# Patient Record
Sex: Female | Born: 1980 | Race: White | Hispanic: No | Marital: Single | Smoking: Never smoker
Health system: Southern US, Community
[De-identification: ages and names within clinical notes are randomized; demographics above are authoritative.]

---

## 2006-05-29 ENCOUNTER — Other Ambulatory Visit: Admission: RE | Admit: 2006-05-29 | Discharge: 2006-05-29 | Payer: Self-pay | Admitting: Gynecology

## 2006-07-24 ENCOUNTER — Ambulatory Visit: Payer: Self-pay | Admitting: Family Medicine

## 2006-07-24 LAB — CONVERTED CEMR LAB
Basophils Absolute: 0 10*3/uL (ref 0.0–0.1)
Cholesterol: 185 mg/dL (ref 0–200)
Eosinophils Relative: 2 % (ref 0.0–5.0)
HCT: 37.5 % (ref 36.0–46.0)
Hemoglobin: 13 g/dL (ref 12.0–15.0)
LDL Cholesterol: 97 mg/dL (ref 0–99)
MCHC: 34.6 g/dL (ref 30.0–36.0)
MCV: 87.3 fL (ref 78.0–100.0)
Monocytes Absolute: 0.5 10*3/uL (ref 0.2–0.7)
Neutrophils Relative %: 61.8 % (ref 43.0–77.0)
RBC: 4.29 M/uL (ref 3.87–5.11)
RDW: 12.1 % (ref 11.5–14.6)
WBC: 6.9 10*3/uL (ref 4.5–10.5)

## 2006-09-02 DIAGNOSIS — R8789 Other abnormal findings in specimens from female genital organs: Secondary | ICD-10-CM

## 2006-09-03 ENCOUNTER — Ambulatory Visit: Payer: Self-pay | Admitting: Internal Medicine

## 2006-09-03 LAB — CONVERTED CEMR LAB
Albumin: 3.3 g/dL — ABNORMAL LOW (ref 3.5–5.2)
Alkaline Phosphatase: 51 units/L (ref 39–117)
BUN: 13 mg/dL (ref 6–23)
Basophils Absolute: 0 10*3/uL (ref 0.0–0.1)
Creatinine, Ser: 0.7 mg/dL (ref 0.4–1.2)
Eosinophils Absolute: 0.2 10*3/uL (ref 0.0–0.6)
GFR calc Af Amer: 128 mL/min
GFR calc non Af Amer: 106 mL/min
HCT: 38.3 % (ref 36.0–46.0)
Hemoglobin: 13.3 g/dL (ref 12.0–15.0)
MCHC: 34.6 g/dL (ref 30.0–36.0)
MCV: 87.1 fL (ref 78.0–100.0)
Monocytes Absolute: 0.6 10*3/uL (ref 0.2–0.7)
Monocytes Relative: 7.3 % (ref 3.0–11.0)
Neutro Abs: 5.1 10*3/uL (ref 1.4–7.7)
Neutrophils Relative %: 64.9 % (ref 43.0–77.0)
Potassium: 4 meq/L (ref 3.5–5.1)
RDW: 11.9 % (ref 11.5–14.6)
Sodium: 138 meq/L (ref 135–145)
TSH: 1.92 microintl units/mL (ref 0.35–5.50)
Total Bilirubin: 0.5 mg/dL (ref 0.3–1.2)

## 2006-10-29 ENCOUNTER — Other Ambulatory Visit: Admission: RE | Admit: 2006-10-29 | Discharge: 2006-10-29 | Payer: Self-pay | Admitting: Gynecology

## 2007-01-01 ENCOUNTER — Ambulatory Visit: Payer: Self-pay | Admitting: Family Medicine

## 2007-01-01 ENCOUNTER — Telehealth (INDEPENDENT_AMBULATORY_CARE_PROVIDER_SITE_OTHER): Payer: Self-pay | Admitting: *Deleted

## 2007-01-01 DIAGNOSIS — R002 Palpitations: Secondary | ICD-10-CM

## 2007-01-02 ENCOUNTER — Telehealth (INDEPENDENT_AMBULATORY_CARE_PROVIDER_SITE_OTHER): Payer: Self-pay | Admitting: *Deleted

## 2007-01-03 ENCOUNTER — Telehealth (INDEPENDENT_AMBULATORY_CARE_PROVIDER_SITE_OTHER): Payer: Self-pay | Admitting: *Deleted

## 2007-02-06 ENCOUNTER — Ambulatory Visit: Payer: Self-pay | Admitting: Cardiology

## 2007-02-18 ENCOUNTER — Telehealth (INDEPENDENT_AMBULATORY_CARE_PROVIDER_SITE_OTHER): Payer: Self-pay | Admitting: *Deleted

## 2007-02-20 ENCOUNTER — Telehealth (INDEPENDENT_AMBULATORY_CARE_PROVIDER_SITE_OTHER): Payer: Self-pay | Admitting: *Deleted

## 2007-02-20 ENCOUNTER — Ambulatory Visit: Payer: Self-pay | Admitting: Family Medicine

## 2007-02-20 ENCOUNTER — Encounter (INDEPENDENT_AMBULATORY_CARE_PROVIDER_SITE_OTHER): Payer: Self-pay | Admitting: *Deleted

## 2007-02-20 DIAGNOSIS — J069 Acute upper respiratory infection, unspecified: Secondary | ICD-10-CM | POA: Insufficient documentation

## 2007-02-24 ENCOUNTER — Telehealth (INDEPENDENT_AMBULATORY_CARE_PROVIDER_SITE_OTHER): Payer: Self-pay | Admitting: *Deleted

## 2007-03-05 ENCOUNTER — Other Ambulatory Visit: Admission: RE | Admit: 2007-03-05 | Discharge: 2007-03-05 | Payer: Self-pay | Admitting: Gynecology

## 2007-05-27 ENCOUNTER — Ambulatory Visit: Payer: Self-pay | Admitting: Family Medicine

## 2007-08-27 ENCOUNTER — Other Ambulatory Visit: Admission: RE | Admit: 2007-08-27 | Discharge: 2007-08-27 | Payer: Self-pay | Admitting: Gynecology

## 2008-03-16 ENCOUNTER — Other Ambulatory Visit: Admission: RE | Admit: 2008-03-16 | Discharge: 2008-03-16 | Payer: Self-pay | Admitting: Gynecology

## 2008-05-04 ENCOUNTER — Ambulatory Visit: Payer: Self-pay | Admitting: Family Medicine

## 2008-05-04 DIAGNOSIS — G43009 Migraine without aura, not intractable, without status migrainosus: Secondary | ICD-10-CM | POA: Insufficient documentation

## 2008-05-06 ENCOUNTER — Encounter: Admission: RE | Admit: 2008-05-06 | Discharge: 2008-05-06 | Payer: Self-pay | Admitting: Family Medicine

## 2008-05-06 ENCOUNTER — Telehealth (INDEPENDENT_AMBULATORY_CARE_PROVIDER_SITE_OTHER): Payer: Self-pay | Admitting: *Deleted

## 2008-05-10 ENCOUNTER — Telehealth (INDEPENDENT_AMBULATORY_CARE_PROVIDER_SITE_OTHER): Payer: Self-pay | Admitting: *Deleted

## 2008-05-14 ENCOUNTER — Telehealth (INDEPENDENT_AMBULATORY_CARE_PROVIDER_SITE_OTHER): Payer: Self-pay | Admitting: *Deleted

## 2008-05-18 ENCOUNTER — Telehealth: Payer: Self-pay | Admitting: Family Medicine

## 2008-05-19 ENCOUNTER — Telehealth (INDEPENDENT_AMBULATORY_CARE_PROVIDER_SITE_OTHER): Payer: Self-pay | Admitting: *Deleted

## 2008-05-20 ENCOUNTER — Encounter (INDEPENDENT_AMBULATORY_CARE_PROVIDER_SITE_OTHER): Payer: Self-pay | Admitting: *Deleted

## 2008-05-20 LAB — CONVERTED CEMR LAB
BUN: 19 mg/dL (ref 6–23)
Basophils Absolute: 0.1 10*3/uL (ref 0.0–0.1)
Basophils Relative: 0.8 % (ref 0.0–3.0)
CO2: 28 meq/L (ref 19–32)
Calcium: 9.1 mg/dL (ref 8.4–10.5)
Creatinine, Ser: 0.7 mg/dL (ref 0.4–1.2)
Eosinophils Absolute: 0.1 10*3/uL (ref 0.0–0.7)
Eosinophils Relative: 0.8 % (ref 0.0–5.0)
Glucose, Bld: 93 mg/dL (ref 70–99)
HCT: 33.7 % — ABNORMAL LOW (ref 36.0–46.0)
MCHC: 33.5 g/dL (ref 30.0–36.0)
MCV: 88.4 fL (ref 78.0–100.0)
Monocytes Absolute: 0.5 10*3/uL (ref 0.1–1.0)
RBC: 3.81 M/uL — ABNORMAL LOW (ref 3.87–5.11)
WBC: 8.9 10*3/uL (ref 4.5–10.5)

## 2008-05-24 ENCOUNTER — Telehealth (INDEPENDENT_AMBULATORY_CARE_PROVIDER_SITE_OTHER): Payer: Self-pay | Admitting: *Deleted

## 2008-05-26 ENCOUNTER — Telehealth: Payer: Self-pay | Admitting: Family Medicine

## 2008-06-14 ENCOUNTER — Telehealth (INDEPENDENT_AMBULATORY_CARE_PROVIDER_SITE_OTHER): Payer: Self-pay | Admitting: *Deleted

## 2008-06-17 ENCOUNTER — Telehealth (INDEPENDENT_AMBULATORY_CARE_PROVIDER_SITE_OTHER): Payer: Self-pay | Admitting: *Deleted

## 2008-07-02 ENCOUNTER — Ambulatory Visit: Payer: Self-pay | Admitting: Family Medicine

## 2008-07-27 ENCOUNTER — Telehealth (INDEPENDENT_AMBULATORY_CARE_PROVIDER_SITE_OTHER): Payer: Self-pay | Admitting: *Deleted

## 2008-07-27 ENCOUNTER — Ambulatory Visit: Payer: Self-pay | Admitting: Family Medicine

## 2008-07-27 DIAGNOSIS — H1030 Unspecified acute conjunctivitis, unspecified eye: Secondary | ICD-10-CM | POA: Insufficient documentation

## 2008-11-01 ENCOUNTER — Telehealth (INDEPENDENT_AMBULATORY_CARE_PROVIDER_SITE_OTHER): Payer: Self-pay | Admitting: *Deleted

## 2008-12-02 ENCOUNTER — Ambulatory Visit: Payer: Self-pay | Admitting: Family Medicine

## 2008-12-07 ENCOUNTER — Encounter (INDEPENDENT_AMBULATORY_CARE_PROVIDER_SITE_OTHER): Payer: Self-pay | Admitting: *Deleted

## 2008-12-13 ENCOUNTER — Telehealth (INDEPENDENT_AMBULATORY_CARE_PROVIDER_SITE_OTHER): Payer: Self-pay | Admitting: *Deleted

## 2008-12-16 ENCOUNTER — Ambulatory Visit: Payer: Self-pay | Admitting: Family Medicine

## 2009-01-05 ENCOUNTER — Telehealth: Payer: Self-pay | Admitting: Family Medicine

## 2009-01-19 ENCOUNTER — Telehealth: Payer: Self-pay | Admitting: Family Medicine

## 2009-01-26 ENCOUNTER — Ambulatory Visit: Payer: Self-pay | Admitting: Family Medicine

## 2009-04-19 ENCOUNTER — Encounter: Payer: Self-pay | Admitting: Family Medicine

## 2010-05-21 LAB — CONVERTED CEMR LAB
Basophils Relative: 0.8 % (ref 0.0–1.0)
CO2: 28 meq/L (ref 19–32)
Calcium: 9.3 mg/dL (ref 8.4–10.5)
Chloride: 107 meq/L (ref 96–112)
Eosinophils Absolute: 0.1 10*3/uL (ref 0.0–0.6)
Eosinophils Relative: 1.6 % (ref 0.0–5.0)
GFR calc Af Amer: 85 mL/min
Glucose, Bld: 96 mg/dL (ref 70–99)
HCT: 36.6 % (ref 36.0–46.0)
Lymphocytes Relative: 21.2 % (ref 12.0–46.0)
MCV: 86.9 fL (ref 78.0–100.0)
Neutro Abs: 6 10*3/uL (ref 1.4–7.7)
Neutrophils Relative %: 70.9 % (ref 43.0–77.0)
Platelets: 353 10*3/uL (ref 150–400)
Sodium: 141 meq/L (ref 135–145)
WBC: 8.5 10*3/uL (ref 4.5–10.5)

## 2010-05-23 NOTE — Consult Note (Signed)
Summary: [facility]  [facility]   Imported By: Lanelle Bal 05/05/2009 08:24:23  _____________________________________________________________________  External Attachment:    Type:   Image     Comment:   External Document

## 2010-09-05 NOTE — Assessment & Plan Note (Signed)
[facility]                            CARDIOLOGY OFFICE NOTE   NAME:Heather House, Heather House                        MRN:          [id number]  DATE:02/06/2007                            DOB:          1981-04-18    PRIMARY CARE PHYSICIAN:  Leanne Chang, M.D.   REASON FOR PRESENTATION:  Evaluate patient with palpitations.   HISTORY OF PRESENT ILLNESS:  The patient is a pleasant 30 year old white  female without prior cardiac history. In early September, she had about  10 days of palpitations. She said these would happen 3-4 times a day.  She described a fluttering. This was not a sustained tachy arrhythmia.  It was noticeable but not associated with pain, pre-syncope or syncope.  She did not have any particular shortness of breath with it. It happened  without provocation. She was not drinking caffeine and otherwise felt  well. She is active and goes to the gym about four times a week. She  works aerobically and does not bring on any of these symptoms. She does  not have chest pressure, neck or arm discomfort. She denies any resting  shortness of breath. Denies PND or orthopnea. She says that, actually  since these ten days in September, they have been much less noticeable  and happening very infrequently.   PAST MEDICAL HISTORY:  Pneumonia some years ago.   PAST SURGICAL HISTORY:  None.   ALLERGIES:  None.   MEDICATIONS:  Yaz.   SOCIAL HISTORY:  The patient is single. She has no children. Works at  [employer]. She does not drink alcohol or smoke cigarettes.   FAMILY HISTORY:  Is noncontributory for early coronary artery disease,  sudden cardiac death, syncope, heart failure.   REVIEW OF SYSTEMS:  Positive for occasional mild headaches. Negative for  other systems.   PHYSICAL EXAMINATION:  The patient is in no distress. Blood pressure  124/78, heart rate 87 and regular, weight 190 pounds, Body Mass Index is  32.  HEENT: Eyelids  unremarkable. Pupils equal, round, and reactive to light.  Fundi not visualized. Oral mucosa is unremarkable.  NECK: No jugular venous distention, waveform within normal limits.  Carotid upstroke brisk and symmetrical. No bruits, no thyromegaly.  LYMPHATICS: No cervical, axillary or inguinal adenopathy.  LUNGS: Clear to auscultation bilaterally.  BACK: No costovertebral angle tenderness.  CHEST: Unremarkable.  HEART: PMI not displaced or sustained. S1, S2 within normal limits. No  S3. No S4. No clicks, rub or murmurs.  ABDOMEN: Flat, positive bowel sounds, normal in frequency and pitch. No  bruits. No rebounds. No guarding. No midline pulsatile mass. No  hepatomegaly, no splenomegaly.  SKIN: No rashes, no nodules.  EXTREMITIES: 2+ pulses throughout. No edema, cyanosis or clubbing.  NEURO: Oriented to person, place and time. Cranial nerves II-XII grossly  intact. Motor grossly intact throughout.   EKG: Sinus rhythm with sinus arrhythmia. Axis within normal limits.  Intervals within normal limits. No acute ST-T wave changes.   ASSESSMENT/PLAN:  1. Palpitations. The patient had palpitations in early September.      Currently these are  not particularly bothersome though they are      still happening rarely. She has a normal physical examination and      normal EKG. She reports that she had normal thyroid studies and      other labs at Dr.  Laqueta Linden office. She is not particularly      bothered by these. We had a long discussion about the etiology of      what her probable PACs or PVCs in an otherwise normal heart. At      this point, she is going to let me know if they ever increase in      frequency or severity at which point I would most likely perform an      echocardiogram and Holter monitor and maybe treat with beta-blocker      or calcium channel blocker. Otherwise, I think we can manage this      conservatively.  2. Followup. I will see the patient back as needed.      Rollene Rotunda, MD, [facility]  Electronically Signed    JH/MedQ  DD: 02/06/2007  DT: 02/06/2007  Job #: [id number]   cc:   Leanne Chang, M.D.

## 2015-06-12 ENCOUNTER — Emergency Department (HOSPITAL_BASED_OUTPATIENT_CLINIC_OR_DEPARTMENT_OTHER): Payer: BLUE CROSS/BLUE SHIELD

## 2015-06-12 ENCOUNTER — Observation Stay (HOSPITAL_BASED_OUTPATIENT_CLINIC_OR_DEPARTMENT_OTHER)
Admission: EM | Admit: 2015-06-12 | Discharge: 2015-06-14 | Disposition: A | Discharge status: Home / Self Care | Payer: BLUE CROSS/BLUE SHIELD | Attending: General Surgery | Admitting: General Surgery

## 2015-06-12 ENCOUNTER — Encounter (HOSPITAL_BASED_OUTPATIENT_CLINIC_OR_DEPARTMENT_OTHER): Payer: Self-pay | Admitting: *Deleted

## 2015-06-12 DIAGNOSIS — Z6835 Body mass index (BMI) 35.0-35.9, adult: Secondary | ICD-10-CM | POA: Insufficient documentation

## 2015-06-12 DIAGNOSIS — E669 Obesity, unspecified: Secondary | ICD-10-CM | POA: Diagnosis not present

## 2015-06-12 DIAGNOSIS — R52 Pain, unspecified: Secondary | ICD-10-CM

## 2015-06-12 DIAGNOSIS — K3589 Other acute appendicitis without perforation or gangrene: Secondary | ICD-10-CM

## 2015-06-12 DIAGNOSIS — K353 Acute appendicitis with localized peritonitis: Secondary | ICD-10-CM | POA: Diagnosis not present

## 2015-06-12 DIAGNOSIS — K3580 Unspecified acute appendicitis: Secondary | ICD-10-CM | POA: Diagnosis present

## 2015-06-12 DIAGNOSIS — R1031 Right lower quadrant pain: Secondary | ICD-10-CM | POA: Diagnosis present

## 2015-06-12 LAB — CBC WITH DIFFERENTIAL/PLATELET
Basophils Absolute: 0 10*3/uL (ref 0.0–0.1)
Basophils Relative: 0 %
EOS ABS: 0.2 10*3/uL (ref 0.0–0.7)
EOS PCT: 1 %
HCT: 34.5 % — ABNORMAL LOW (ref 36.0–46.0)
Hemoglobin: 10.9 g/dL — ABNORMAL LOW (ref 12.0–15.0)
LYMPHS ABS: 3.7 10*3/uL (ref 0.7–4.0)
LYMPHS PCT: 21 %
MCH: 28 pg (ref 26.0–34.0)
MCHC: 31.6 g/dL (ref 30.0–36.0)
MCV: 88.7 fL (ref 78.0–100.0)
MONO ABS: 1.1 10*3/uL — AB (ref 0.1–1.0)
Monocytes Relative: 7 %
Neutro Abs: 12.1 10*3/uL — ABNORMAL HIGH (ref 1.7–7.7)
Neutrophils Relative %: 71 %
PLATELETS: 332 10*3/uL (ref 150–400)
RBC: 3.89 MIL/uL (ref 3.87–5.11)
RDW: 13.6 % (ref 11.5–15.5)
WBC: 17.2 10*3/uL — AB (ref 4.0–10.5)

## 2015-06-12 LAB — URINALYSIS, ROUTINE W REFLEX MICROSCOPIC
BILIRUBIN URINE: NEGATIVE
Glucose, UA: NEGATIVE mg/dL
HGB URINE DIPSTICK: NEGATIVE
KETONES UR: NEGATIVE mg/dL
Leukocytes, UA: NEGATIVE
NITRITE: NEGATIVE
PH: 6 (ref 5.0–8.0)
Protein, ur: NEGATIVE mg/dL
SPECIFIC GRAVITY, URINE: 1.025 (ref 1.005–1.030)

## 2015-06-12 LAB — COMPREHENSIVE METABOLIC PANEL
ALT: 16 U/L (ref 14–54)
ANION GAP: 7 (ref 5–15)
AST: 17 U/L (ref 15–41)
Albumin: 3.6 g/dL (ref 3.5–5.0)
Alkaline Phosphatase: 62 U/L (ref 38–126)
BUN: 22 mg/dL — ABNORMAL HIGH (ref 6–20)
CHLORIDE: 105 mmol/L (ref 101–111)
CO2: 27 mmol/L (ref 22–32)
Calcium: 8.5 mg/dL — ABNORMAL LOW (ref 8.9–10.3)
Creatinine, Ser: 0.87 mg/dL (ref 0.44–1.00)
Glucose, Bld: 94 mg/dL (ref 65–99)
POTASSIUM: 3.7 mmol/L (ref 3.5–5.1)
SODIUM: 139 mmol/L (ref 135–145)
Total Bilirubin: 0.4 mg/dL (ref 0.3–1.2)
Total Protein: 6.8 g/dL (ref 6.5–8.1)

## 2015-06-12 LAB — PREGNANCY, URINE: PREG TEST UR: NEGATIVE

## 2015-06-12 MED ORDER — ONDANSETRON HCL 4 MG/2ML IJ SOLN
4.0000 mg | Freq: Once | INTRAMUSCULAR | Status: DC
  Filled 2015-06-12: qty 2

## 2015-06-12 MED ORDER — KETOROLAC TROMETHAMINE 30 MG/ML IJ SOLN
30.0000 mg | Freq: Once | INTRAMUSCULAR | Status: AC
  Administered 2015-06-12: 30 mg via INTRAVENOUS
  Filled 2015-06-12: qty 1

## 2015-06-12 MED ORDER — DICYCLOMINE HCL 10 MG/ML IM SOLN
20.0000 mg | Freq: Once | INTRAMUSCULAR | Status: AC
  Administered 2015-06-12: 20 mg via INTRAMUSCULAR
  Filled 2015-06-12: qty 2

## 2015-06-12 NOTE — ED Notes (Signed)
Pt here with RLQ pain that began suddenly 2 hours ago.  Pt states that it is a sharp pain.  No other symptoms with this.

## 2015-06-12 NOTE — ED Provider Notes (Signed)
CSN: [id number]     Arrival date & time 06/12/15  2038 History  By signing my name below, I, Linus Galas, attest that this documentation has been prepared under the direction and in the presence of Durene Dodge, MD. Electronically Signed: Linus Galas, ED Scribe. 06/13/2015. 11:21 PM.  Chief Complaint  Patient presents with  . Abdominal Pain   Patient is a 35 y.o. female presenting with abdominal pain. The history is provided by the patient. No language interpreter was used.  Abdominal Pain Pain location:  RLQ Pain quality: sharp   Pain radiates to:  Epigastric region Pain severity:  Mild Onset quality:  Sudden Timing:  Constant Progression:  Unchanged Relieved by:  Nothing Worsened by:  Nothing tried Ineffective treatments:  None tried Associated symptoms: no constipation, no diarrhea, no dysuria, no fever, no shortness of breath, no vaginal bleeding, no vaginal discharge and no vomiting    HPI Comments: Heather House is a 35 y.o. female with no PMHx who presents to the Emergency Department complaining of sharp RLQ abd with radiation to epigastrium that began 2 hours, PTA. Onset severity 8/10. Pain improved.  Pain currently 3/10 in severity. Pain worse with laugh. Just finished menstruation period. No recent intense exercises. Pt denies any other symptoms at this time.   History reviewed. No pertinent past medical history. History reviewed. No pertinent past surgical history. No family history on file.   Social History  Substance Use Topics  . Smoking status: Never Smoker   . Smokeless tobacco: None  . Alcohol Use: No   OB History    No data available     Review of Systems  Constitutional: Negative for fever.  Respiratory: Negative for shortness of breath.   Gastrointestinal: Positive for abdominal pain. Negative for vomiting, diarrhea and constipation.  Genitourinary: Negative for dysuria, vaginal bleeding and vaginal discharge.  All other systems reviewed and are  negative.  Allergies  Review of patient's allergies indicates no known allergies.  Home Medications   Prior to Admission medications   Not on File   BP 137/86 mmHg  Pulse 80  Temp(Src) 99.3 F (37.4 C) (Oral)  Resp 18  Wt 202 lb (91.627 kg)  SpO2 100%  LMP 06/06/2015 (Exact Date)   Physical Exam  Constitutional: She is oriented to person, place, and time. She appears well-developed and well-nourished. No distress.  HENT:  Head: Normocephalic and atraumatic.  Mouth/Throat: Oropharynx is clear and moist and mucous membranes are normal. No oropharyngeal exudate.  Eyes: Pupils are equal, round, and reactive to light.  Neck: Normal range of motion. Neck supple.  Cardiovascular: Normal rate, regular rhythm and normal heart sounds.   Pulmonary/Chest: Effort normal. She has no wheezes. She has no rales.  Abdominal: Soft. There is tenderness. There is no rebound and no guarding.  Hyperactive bowel sounds  Musculoskeletal: Normal range of motion.  Lymphadenopathy:    She has no cervical adenopathy.  Neurological: She is alert and oriented to person, place, and time. She has normal reflexes.  Skin: Skin is warm and dry.  Psychiatric: She has a normal mood and affect.  Nursing note and vitals reviewed.  ED Course  Procedures   DIAGNOSTIC STUDIES: Oxygen Saturation is 100% on room air, normal by my interpretation.    COORDINATION OF CARE: 11:21 PM Will order, CT renal stone study, urinalysis and pregnancy screen. Will give Bentyl, Toradol, and Zofran.  Discussed treatment plan with pt at bedside and pt agreed to plan.  Labs Review  Labs Reviewed  CBC WITH DIFFERENTIAL/PLATELET - Abnormal; Notable for the following:    WBC 17.2 (*)    Hemoglobin 10.9 (*)    HCT 34.5 (*)    Neutro Abs 12.1 (*)    Monocytes Absolute 1.1 (*)    All other components within normal limits  COMPREHENSIVE METABOLIC PANEL - Abnormal; Notable for the following:    BUN 22 (*)    Calcium 8.5 (*)     All other components within normal limits  URINALYSIS, ROUTINE W REFLEX MICROSCOPIC (NOT AT [facility])  PREGNANCY, URINE    Imaging Review No results found. I have personally reviewed and evaluated these images and lab results as part of my medical decision-making.   MDM   Final diagnoses:  Pain    Results for orders placed or performed during the hospital encounter of 06/12/15  Urinalysis, Routine w reflex microscopic (not at [facility])  Result Value Ref Range   Color, Urine YELLOW YELLOW   APPearance CLEAR CLEAR   Specific Gravity, Urine 1.025 1.005 - 1.030   pH 6.0 5.0 - 8.0   Glucose, UA NEGATIVE NEGATIVE mg/dL   Hgb urine dipstick NEGATIVE NEGATIVE   Bilirubin Urine NEGATIVE NEGATIVE   Ketones, ur NEGATIVE NEGATIVE mg/dL   Protein, ur NEGATIVE NEGATIVE mg/dL   Nitrite NEGATIVE NEGATIVE   Leukocytes, UA NEGATIVE NEGATIVE  Pregnancy, urine  Result Value Ref Range   Preg Test, Ur NEGATIVE NEGATIVE  CBC with Differential/Platelet  Result Value Ref Range   WBC 17.2 (H) 4.0 - 10.5 K/uL   RBC 3.89 3.87 - 5.11 MIL/uL   Hemoglobin 10.9 (L) 12.0 - 15.0 g/dL   HCT 16.1 (L) 09.6 - 04.5 %   MCV 88.7 78.0 - 100.0 fL   MCH 28.0 26.0 - 34.0 pg   MCHC 31.6 30.0 - 36.0 g/dL   RDW 40.9 81.1 - 91.4 %   Platelets 332 150 - 400 K/uL   Neutrophils Relative % 71 %   Neutro Abs 12.1 (H) 1.7 - 7.7 K/uL   Lymphocytes Relative 21 %   Lymphs Abs 3.7 0.7 - 4.0 K/uL   Monocytes Relative 7 %   Monocytes Absolute 1.1 (H) 0.1 - 1.0 K/uL   Eosinophils Relative 1 %   Eosinophils Absolute 0.2 0.0 - 0.7 K/uL   Basophils Relative 0 %   Basophils Absolute 0.0 0.0 - 0.1 K/uL  Comprehensive metabolic panel  Result Value Ref Range   Sodium 139 135 - 145 mmol/L   Potassium 3.7 3.5 - 5.1 mmol/L   Chloride 105 101 - 111 mmol/L   CO2 27 22 - 32 mmol/L   Glucose, Bld 94 65 - 99 mg/dL   BUN 22 (H) 6 - 20 mg/dL   Creatinine, Ser 7.82 0.44 - 1.00 mg/dL   Calcium 8.5 (L) 8.9 - 10.3 mg/dL   Total Protein 6.8  6.5 - 8.1 g/dL   Albumin 3.6 3.5 - 5.0 g/dL   AST 17 15 - 41 U/L   ALT 16 14 - 54 U/L   Alkaline Phosphatase 62 38 - 126 U/L   Total Bilirubin 0.4 0.3 - 1.2 mg/dL   GFR calc non Af Amer >60 >60 mL/min   GFR calc Af Amer >60 >60 mL/min   Anion gap 7 5 - 15   Ct Renal Stone Study  06/13/2015  CLINICAL DATA:  Right flank/right lower quadrant pain radiating to the epigastrium. EXAM: CT ABDOMEN AND PELVIS WITHOUT CONTRAST TECHNIQUE: Multidetector CT imaging of the abdomen  and pelvis was performed following the standard protocol without IV contrast. COMPARISON:  None. FINDINGS: Lower chest:  The included lung bases are clear. Liver: Borderline hepatic steatosis. No focal allowing for lack contrast lesion. Hepatobiliary: Gallbladder physiologically distended, no calcified stone. No biliary dilatation. Pancreas: No ductal dilatation or inflammation. Spleen: Normal. Adrenal glands: No nodule. Kidneys: Symmetric in size without stones or hydronephrosis. There is no perinephric stranding. Both ureters are decompressed without stones along the course. Stomach/Bowel: Stomach physiologically distended. There are no dilated or thickened small bowel loops. Small-moderate volume of stool throughout the colon without colonic wall thickening. Appendix: Dilated measuring 9 mm with mild periappendiceal inflammatory change. No perforation or abscess. Vascular/Lymphatic: No retroperitoneal adenopathy. Abdominal aorta is normal in caliber. Reproductive: Uterus and adnexa normal for age. Bladder: Decompressed, no bladder stone. Other: No free air, free fluid, or intra-abdominal fluid collection. Musculoskeletal: There are no acute or suspicious osseous abnormalities. IMPRESSION: Uncomplicated acute appendicitis.  No perforation or abscess. Electronically Signed   By: Rubye Oaks M.D.   On: 06/13/2015 00:38    Case d/w Dr. Abbey Chatters, send to ED Case d/w Dr. Rhunette Croft     I personally performed the services described  in this documentation, which was scribed in my presence. The recorded information has been reviewed and is accurate.     Cy Blamer, MD 06/13/15 [phone]

## 2015-06-13 ENCOUNTER — Encounter (HOSPITAL_COMMUNITY)
Admission: EM | Disposition: A | Discharge status: Home / Self Care | Payer: Self-pay | Source: Home / Self Care | Attending: Emergency Medicine

## 2015-06-13 ENCOUNTER — Encounter (HOSPITAL_BASED_OUTPATIENT_CLINIC_OR_DEPARTMENT_OTHER): Payer: Self-pay | Admitting: Emergency Medicine

## 2015-06-13 ENCOUNTER — Observation Stay (HOSPITAL_COMMUNITY): Payer: BLUE CROSS/BLUE SHIELD | Admitting: Anesthesiology

## 2015-06-13 DIAGNOSIS — K3580 Unspecified acute appendicitis: Secondary | ICD-10-CM | POA: Diagnosis present

## 2015-06-13 HISTORY — PX: LAPAROSCOPIC APPENDECTOMY: SHX408

## 2015-06-13 LAB — SURGICAL PCR SCREEN
MRSA, PCR: NEGATIVE
STAPHYLOCOCCUS AUREUS: NEGATIVE

## 2015-06-13 SURGERY — APPENDECTOMY, LAPAROSCOPIC
Anesthesia: General | Site: Abdomen

## 2015-06-13 MED ORDER — DEXAMETHASONE SODIUM PHOSPHATE 10 MG/ML IJ SOLN
INTRAMUSCULAR | Status: DC | PRN
  Administered 2015-06-13: 10 mg via INTRAVENOUS

## 2015-06-13 MED ORDER — SUCCINYLCHOLINE CHLORIDE 20 MG/ML IJ SOLN
INTRAMUSCULAR | Status: DC | PRN
  Administered 2015-06-13: 100 mg via INTRAVENOUS

## 2015-06-13 MED ORDER — ONDANSETRON HCL 4 MG/2ML IJ SOLN
INTRAMUSCULAR | Status: DC | PRN
  Administered 2015-06-13: 4 mg via INTRAVENOUS

## 2015-06-13 MED ORDER — BUPIVACAINE-EPINEPHRINE (PF) 0.25% -1:200000 IJ SOLN
INTRAMUSCULAR | Status: AC
  Filled 2015-06-13: qty 30

## 2015-06-13 MED ORDER — ONDANSETRON 4 MG PO TBDP: 4.0000 mg | ORAL_TABLET | Freq: Four times a day (QID) | ORAL | Status: DC | PRN

## 2015-06-13 MED ORDER — MORPHINE SULFATE (PF) 2 MG/ML IV SOLN
2.0000 mg | INTRAVENOUS | Status: DC | PRN
  Administered 2015-06-13: 2 mg via INTRAVENOUS
  Filled 2015-06-13: qty 1

## 2015-06-13 MED ORDER — HYDROMORPHONE HCL 1 MG/ML IJ SOLN
0.2500 mg | INTRAMUSCULAR | Status: DC | PRN
  Administered 2015-06-13 (×2): 0.5 mg via INTRAVENOUS

## 2015-06-13 MED ORDER — GLYCOPYRROLATE 0.2 MG/ML IJ SOLN
INTRAMUSCULAR | Status: AC
  Filled 2015-06-13: qty 3

## 2015-06-13 MED ORDER — NEOSTIGMINE METHYLSULFATE 10 MG/10ML IV SOLN
INTRAVENOUS | Status: DC | PRN
  Administered 2015-06-13: 5 mg via INTRAVENOUS

## 2015-06-13 MED ORDER — ROCURONIUM BROMIDE 100 MG/10ML IV SOLN
INTRAVENOUS | Status: AC
  Filled 2015-06-13: qty 1

## 2015-06-13 MED ORDER — LACTATED RINGERS IR SOLN
Status: DC | PRN
  Administered 2015-06-13: 1000 mL

## 2015-06-13 MED ORDER — BUPIVACAINE-EPINEPHRINE 0.25% -1:200000 IJ SOLN
INTRAMUSCULAR | Status: DC | PRN
  Administered 2015-06-13: 15 mL

## 2015-06-13 MED ORDER — DEXTROSE 5 % IV SOLN
1.0000 g | Freq: Once | INTRAVENOUS | Status: AC
  Administered 2015-06-13: 1 g via INTRAVENOUS
  Filled 2015-06-13: qty 10

## 2015-06-13 MED ORDER — GLYCOPYRROLATE 0.2 MG/ML IJ SOLN
INTRAMUSCULAR | Status: DC | PRN
  Administered 2015-06-13: 0.6 mg via INTRAVENOUS

## 2015-06-13 MED ORDER — HYDROCODONE-ACETAMINOPHEN 5-325 MG PO TABS
1.0000 | ORAL_TABLET | ORAL | Status: DC | PRN
  Administered 2015-06-13 – 2015-06-14 (×3): 2 via ORAL
  Filled 2015-06-13 (×4): qty 2

## 2015-06-13 MED ORDER — METRONIDAZOLE IN NACL 5-0.79 MG/ML-% IV SOLN
500.0000 mg | Freq: Once | INTRAVENOUS | Status: AC
  Administered 2015-06-13: 500 mg via INTRAVENOUS
  Filled 2015-06-13: qty 100

## 2015-06-13 MED ORDER — LIDOCAINE HCL (CARDIAC) 20 MG/ML IV SOLN
INTRAVENOUS | Status: DC | PRN
  Administered 2015-06-13: 100 mg via INTRAVENOUS

## 2015-06-13 MED ORDER — PIPERACILLIN-TAZOBACTAM 3.375 G IVPB 30 MIN
3.3750 g | Freq: Once | INTRAVENOUS | Status: AC
  Administered 2015-06-13: 3.375 g via INTRAVENOUS
  Filled 2015-06-13 (×2): qty 50

## 2015-06-13 MED ORDER — MIDAZOLAM HCL 5 MG/5ML IJ SOLN
INTRAMUSCULAR | Status: DC | PRN
  Administered 2015-06-13: 2 mg via INTRAVENOUS

## 2015-06-13 MED ORDER — ONDANSETRON HCL 4 MG/2ML IJ SOLN
INTRAMUSCULAR | Status: AC
  Filled 2015-06-13: qty 2

## 2015-06-13 MED ORDER — ROCURONIUM BROMIDE 100 MG/10ML IV SOLN
INTRAVENOUS | Status: DC | PRN
  Administered 2015-06-13: 40 mg via INTRAVENOUS

## 2015-06-13 MED ORDER — OXYCODONE HCL 5 MG PO TABS: 5.0000 mg | ORAL_TABLET | Freq: Once | ORAL | Status: DC | PRN

## 2015-06-13 MED ORDER — ONDANSETRON HCL 4 MG/2ML IJ SOLN: 4.0000 mg | INTRAMUSCULAR | Status: DC | PRN

## 2015-06-13 MED ORDER — CETYLPYRIDINIUM CHLORIDE 0.05 % MT LIQD
7.0000 mL | Freq: Two times a day (BID) | OROMUCOSAL | Status: DC
  Administered 2015-06-13: 7 mL via OROMUCOSAL

## 2015-06-13 MED ORDER — MIDAZOLAM HCL 2 MG/2ML IJ SOLN
INTRAMUSCULAR | Status: AC
  Filled 2015-06-13: qty 2

## 2015-06-13 MED ORDER — LACTATED RINGERS IV SOLN
INTRAVENOUS | Status: DC | PRN
  Administered 2015-06-13 (×2): via INTRAVENOUS

## 2015-06-13 MED ORDER — MORPHINE SULFATE (PF) 2 MG/ML IV SOLN: 2.0000 mg | INTRAVENOUS | Status: DC | PRN

## 2015-06-13 MED ORDER — LIDOCAINE HCL (CARDIAC) 20 MG/ML IV SOLN
INTRAVENOUS | Status: AC
  Filled 2015-06-13: qty 5

## 2015-06-13 MED ORDER — ONDANSETRON HCL 4 MG/2ML IJ SOLN: 4.0000 mg | Freq: Four times a day (QID) | INTRAMUSCULAR | Status: DC | PRN

## 2015-06-13 MED ORDER — NEOSTIGMINE METHYLSULFATE 10 MG/10ML IV SOLN
INTRAVENOUS | Status: AC
  Filled 2015-06-13: qty 1

## 2015-06-13 MED ORDER — OXYCODONE HCL 5 MG/5ML PO SOLN
5.0000 mg | Freq: Once | ORAL | Status: DC | PRN
  Filled 2015-06-13: qty 5

## 2015-06-13 MED ORDER — DEXTROSE-NACL 5-0.9 % IV SOLN
INTRAVENOUS | Status: DC
  Administered 2015-06-13: 100 mL/h via INTRAVENOUS
  Administered 2015-06-13 – 2015-06-14 (×2): via INTRAVENOUS

## 2015-06-13 MED ORDER — FENTANYL CITRATE (PF) 100 MCG/2ML IJ SOLN
INTRAMUSCULAR | Status: DC | PRN
  Administered 2015-06-13: 50 ug via INTRAVENOUS

## 2015-06-13 MED ORDER — MORPHINE SULFATE (PF) 4 MG/ML IV SOLN
4.0000 mg | Freq: Once | INTRAVENOUS | Status: AC
  Administered 2015-06-13: 4 mg via INTRAVENOUS
  Filled 2015-06-13: qty 1

## 2015-06-13 MED ORDER — HYDROMORPHONE HCL 1 MG/ML IJ SOLN
INTRAMUSCULAR | Status: AC
  Filled 2015-06-13: qty 1

## 2015-06-13 MED ORDER — PROPOFOL 10 MG/ML IV BOLUS
INTRAVENOUS | Status: DC | PRN
  Administered 2015-06-13: 160 mg via INTRAVENOUS

## 2015-06-13 MED ORDER — DEXTROSE 5 % IV SOLN: 1.0000 g | Freq: Once | INTRAVENOUS | Status: DC

## 2015-06-13 MED ORDER — PROPOFOL 10 MG/ML IV BOLUS
INTRAVENOUS | Status: AC
  Filled 2015-06-13: qty 20

## 2015-06-13 MED ORDER — 0.9 % SODIUM CHLORIDE (POUR BTL) OPTIME
TOPICAL | Status: DC | PRN
  Administered 2015-06-13: 1000 mL

## 2015-06-13 MED ORDER — FENTANYL CITRATE (PF) 250 MCG/5ML IJ SOLN
INTRAMUSCULAR | Status: AC
  Filled 2015-06-13: qty 5

## 2015-06-13 MED ORDER — DEXAMETHASONE SODIUM PHOSPHATE 10 MG/ML IJ SOLN
INTRAMUSCULAR | Status: AC
  Filled 2015-06-13: qty 1

## 2015-06-13 SURGICAL SUPPLY — 34 items
APPLIER CLIP 5 13 M/L LIGAMAX5 (MISCELLANEOUS)
APPLIER CLIP ROT 10 11.4 M/L (STAPLE)
APR CLP MED LRG 11.4X10 (STAPLE)
APR CLP MED LRG 5 ANG JAW (MISCELLANEOUS)
BAG SPEC RTRVL LRG 6X4 10 (ENDOMECHANICALS) ×1
CABLE HIGH FREQUENCY MONO STRZ (ELECTRODE) ×3 IMPLANT
CHLORAPREP W/TINT 26ML (MISCELLANEOUS) ×3 IMPLANT
CLIP APPLIE 5 13 M/L LIGAMAX5 (MISCELLANEOUS) IMPLANT
CLIP APPLIE ROT 10 11.4 M/L (STAPLE) IMPLANT
COVER SURGICAL LIGHT HANDLE (MISCELLANEOUS) IMPLANT
CUTTER FLEX LINEAR 45M (STAPLE) ×3 IMPLANT
DECANTER SPIKE VIAL GLASS SM (MISCELLANEOUS) ×3 IMPLANT
DRAPE LAPAROSCOPIC ABDOMINAL (DRAPES) ×3 IMPLANT
ELECT REM PT RETURN 9FT ADLT (ELECTROSURGICAL) ×3
ELECTRODE REM PT RTRN 9FT ADLT (ELECTROSURGICAL) ×1 IMPLANT
GLOVE BIOGEL PI IND STRL 7.5 (GLOVE) ×1 IMPLANT
GLOVE BIOGEL PI INDICATOR 7.5 (GLOVE) ×2
GLOVE ECLIPSE 7.5 STRL STRAW (GLOVE) ×3 IMPLANT
GOWN STRL REUS W/TWL XL LVL3 (GOWN DISPOSABLE) ×6 IMPLANT
KIT BASIN OR (CUSTOM PROCEDURE TRAY) ×3 IMPLANT
LIQUID BAND (GAUZE/BANDAGES/DRESSINGS) ×3 IMPLANT
POUCH SPECIMEN RETRIEVAL 10MM (ENDOMECHANICALS) ×3 IMPLANT
RELOAD 45 VASCULAR/THIN (ENDOMECHANICALS) IMPLANT
RELOAD STAPLE TA45 3.5 REG BLU (ENDOMECHANICALS) ×3 IMPLANT
SCISSORS LAP 5X35 DISP (ENDOMECHANICALS) ×3 IMPLANT
SET IRRIG TUBING LAPAROSCOPIC (IRRIGATION / IRRIGATOR) ×3 IMPLANT
SHEARS HARMONIC ACE PLUS 36CM (ENDOMECHANICALS) ×3 IMPLANT
SLEEVE XCEL OPT CAN 5 100 (ENDOMECHANICALS) ×3 IMPLANT
SUT MNCRL AB 4-0 PS2 18 (SUTURE) ×3 IMPLANT
TOWEL OR 17X26 10 PK STRL BLUE (TOWEL DISPOSABLE) ×3 IMPLANT
TRAY FOLEY W/METER SILVER 14FR (SET/KITS/TRAYS/PACK) ×3 IMPLANT
TRAY LAPAROSCOPIC (CUSTOM PROCEDURE TRAY) ×3 IMPLANT
TROCAR BLADELESS OPT 5 100 (ENDOMECHANICALS) ×3 IMPLANT
TROCAR XCEL BLUNT TIP 100MML (ENDOMECHANICALS) ×3 IMPLANT

## 2015-06-13 NOTE — Op Note (Signed)
Preoperative Diagnosis: Pain [R52] Other acute appendicitis [K35.89]  Postoprative Diagnosis: Pain [R52] Other acute appendicitis [K35.89]  Procedure: Procedure(s): APPENDECTOMY LAPAROSCOPIC   Surgeon: Glenna Fellows T   Assistants: none  Anesthesia:  General endotracheal anesthesia  Indications: patient is a 35 year old female who presents with symptoms, physical findings and lab work consistent with acute appendicitis. CT scan has shown evidence of uncomplicated acute appendicitis. After preoperative discussion detailed elsewhere we have elected to proceed with laparoscopic appendectomy.    Procedure Detail:  Patient was brought to the operating room, placed in supine position on the operating table, and general endotracheal anesthesia induced. She had received broad spectrum IV antibiotics. PAS were in place. Foley catheter was placed. The abdomen was widely sterilely prepped and draped. Patient timeout was performed and correct procedure verified. Access was obtained with an open Hassan 11 mm trocar at the umbilicus without difficulty and pneumoperitoneum established. Under direct vision 5 mm trochars were placed in the right upper quadrant and the left lower quadrant. The appendix was visualized lying below the cecum and the distal appendix was edematous and inflamed without gangrene or perforation. The appendix was elevated and mobilized by dividing lateral peritoneal attachments. The mesoappendix was divided with Harmonic scalpel until the appendix was completely freed down to the tip of the cecum. The appendix was divided across the tip of the cecum with a single firing of the blue load 45 mm GIA stapler. The staple line was intact and without bleeding. The right lower quadrant was thoroughly irrigated and hemostasis assured. There was no evidence of trocar injury or other problems. The appendix was placed in an Endo Catch bag and brought out through the umbilical incision. [facility]  trocar was removed and the mattress sutures secured. All CO2 was evacuated and trochars removed. Skin incisions were closed with subcuticular Monocryl and Liquiban. Sponge needle and instrument counts were correct.    Findings: Acute appendicitis without perforation or gangrene  Estimated Blood Loss:  Minimal         Drains: none  Blood Given: none          Specimens: appendix        Complications:  * No complications entered in OR log *         Disposition: PACU - hemodynamically stable.         Condition: stable

## 2015-06-13 NOTE — Anesthesia Postprocedure Evaluation (Signed)
Anesthesia Post Note  Patient: Heather House  Procedure(s) Performed: Procedure(s) (LRB): APPENDECTOMY LAPAROSCOPIC (N/A)  Patient location during evaluation: PACU Anesthesia Type: General Level of consciousness: awake and alert and patient cooperative Pain management: pain level controlled Vital Signs Assessment: post-procedure vital signs reviewed and stable Respiratory status: spontaneous breathing and respiratory function stable Cardiovascular status: stable Anesthetic complications: no    Last Vitals:  Filed Vitals:   06/13/15 1111 06/13/15 1216  BP: 111/65 112/64  Pulse: 63 66  Temp: 36.6 C 37 C  Resp: 16 16    Last Pain:  Filed Vitals:   06/13/15 1217  PainSc: 0-No pain                 Darrnell Mangiaracina S

## 2015-06-13 NOTE — ED Provider Notes (Signed)
3:05 AM Patient transferred from [facility] for acute appendicitis. No c/o pain at this time. Pain well controlled with Toradol and morphine. Patient declines additional pain medication. She has been made aware of NPO status. Abdominal exam with TTP in the RLQ, focal, without peritoneal signs or rigidity. Will consult [facility]CS to inform of patient arrival.   3:27 AM Dr. Abbey Chatters made aware of patient arrival.  Filed Vitals:   06/12/15 2057 06/12/15 2315 06/13/15 0100  BP: 145/96 137/86 118/83  Pulse: 91 80   Temp: 99.3 F (37.4 C)    TempSrc: Oral    Resp: 20 18   Weight: 91.627 kg    SpO2: 100% 100%      Antony Madura, PA-C 06/13/15 0327

## 2015-06-13 NOTE — ED Notes (Signed)
Bed: RESB Expected date:  Expected time:  Means of arrival:  Comments: + appendicitis from [facility]

## 2015-06-13 NOTE — Addendum Note (Signed)
Addendum  created 06/13/15 1237 by Doran Clay, CRNA   Modules edited: Charges VN

## 2015-06-13 NOTE — H&P (Signed)
Heather House is an 35 y.o. female.   Chief Complaint:  Worsening right lower quadrant abdominal pain HPI:   She awoke yesterday morning with diarrhea which is unusual for her. Yesterday evening's 6:00 she noted some pain in the right lower quadrant that rapidly escalated. She went to the [facility].  She was noted to have a leukocytosis. CT scan  Was performed which demonstrated dilated appendix with surrounding inflammatory changes consistent with acute appendicitis. She is transferred here for definitive management. She was given intravenous Flagyl at the other facility.  History reviewed. No pertinent past medical history.  History reviewed. No pertinent past surgical history.  History reviewed. No pertinent family history. Social History:  reports that she has never smoked. She does not have any smokeless tobacco history on file. She reports that she does not drink alcohol. Her drug history is not on file.  Allergies: No Known Allergies   (Not in a hospital admission)  Results for orders placed or performed during the hospital encounter of 06/12/15 (from the past 48 hour(s))  Urinalysis, Routine w reflex microscopic (not at [facility])     Status: None   Collection Time: 06/12/15  9:00 PM  Result Value Ref Range   Color, Urine YELLOW YELLOW   APPearance CLEAR CLEAR   Specific Gravity, Urine 1.025 1.005 - 1.030   pH 6.0 5.0 - 8.0   Glucose, UA NEGATIVE NEGATIVE mg/dL   Hgb urine dipstick NEGATIVE NEGATIVE   Bilirubin Urine NEGATIVE NEGATIVE   Ketones, ur NEGATIVE NEGATIVE mg/dL   Protein, ur NEGATIVE NEGATIVE mg/dL   Nitrite NEGATIVE NEGATIVE   Leukocytes, UA NEGATIVE NEGATIVE    Comment: MICROSCOPIC NOT DONE ON URINES WITH NEGATIVE PROTEIN, BLOOD, LEUKOCYTES, NITRITE, OR GLUCOSE <1000 mg/dL.  Pregnancy, urine     Status: None   Collection Time: 06/12/15  9:00 PM  Result Value Ref Range   Preg Test, Ur NEGATIVE NEGATIVE    Comment:        THE SENSITIVITY OF  THIS METHODOLOGY IS >20 mIU/mL.   CBC with Differential/Platelet     Status: Abnormal   Collection Time: 06/12/15 11:30 PM  Result Value Ref Range   WBC 17.2 (H) 4.0 - 10.5 K/uL   RBC 3.89 3.87 - 5.11 MIL/uL   Hemoglobin 10.9 (L) 12.0 - 15.0 g/dL   HCT 34.5 (L) 36.0 - 46.0 %   MCV 88.7 78.0 - 100.0 fL   MCH 28.0 26.0 - 34.0 pg   MCHC 31.6 30.0 - 36.0 g/dL   RDW 13.6 11.5 - 15.5 %   Platelets 332 150 - 400 K/uL   Neutrophils Relative % 71 %   Neutro Abs 12.1 (H) 1.7 - 7.7 K/uL   Lymphocytes Relative 21 %   Lymphs Abs 3.7 0.7 - 4.0 K/uL   Monocytes Relative 7 %   Monocytes Absolute 1.1 (H) 0.1 - 1.0 K/uL   Eosinophils Relative 1 %   Eosinophils Absolute 0.2 0.0 - 0.7 K/uL   Basophils Relative 0 %   Basophils Absolute 0.0 0.0 - 0.1 K/uL  Comprehensive metabolic panel     Status: Abnormal   Collection Time: 06/12/15 11:30 PM  Result Value Ref Range   Sodium 139 135 - 145 mmol/L   Potassium 3.7 3.5 - 5.1 mmol/L   Chloride 105 101 - 111 mmol/L   CO2 27 22 - 32 mmol/L   Glucose, Bld 94 65 - 99 mg/dL   BUN 22 (H) 6 - 20  mg/dL   Creatinine, Ser 0.87 0.44 - 1.00 mg/dL   Calcium 8.5 (L) 8.9 - 10.3 mg/dL   Total Protein 6.8 6.5 - 8.1 g/dL   Albumin 3.6 3.5 - 5.0 g/dL   AST 17 15 - 41 U/L   ALT 16 14 - 54 U/L   Alkaline Phosphatase 62 38 - 126 U/L   Total Bilirubin 0.4 0.3 - 1.2 mg/dL   GFR calc non Af Amer >60 >60 mL/min   GFR calc Af Amer >60 >60 mL/min    Comment: (NOTE) The eGFR has been calculated using the CKD EPI equation. This calculation has not been validated in all clinical situations. eGFR's persistently <60 mL/min signify possible Chronic Kidney Disease.    Anion gap 7 5 - 15   Ct Renal Stone Study  06/13/2015  CLINICAL DATA:  Right flank/right lower quadrant pain radiating to the epigastrium. EXAM: CT ABDOMEN AND PELVIS WITHOUT CONTRAST TECHNIQUE: Multidetector CT imaging of the abdomen and pelvis was performed following the standard protocol without IV contrast.  COMPARISON:  None. FINDINGS: Lower chest:  The included lung bases are clear. Liver: Borderline hepatic steatosis. No focal allowing for lack contrast lesion. Hepatobiliary: Gallbladder physiologically distended, no calcified stone. No biliary dilatation. Pancreas: No ductal dilatation or inflammation. Spleen: Normal. Adrenal glands: No nodule. Kidneys: Symmetric in size without stones or hydronephrosis. There is no perinephric stranding. Both ureters are decompressed without stones along the course. Stomach/Bowel: Stomach physiologically distended. There are no dilated or thickened small bowel loops. Small-moderate volume of stool throughout the colon without colonic wall thickening. Appendix: Dilated measuring 9 mm with mild periappendiceal inflammatory change. No perforation or abscess. Vascular/Lymphatic: No retroperitoneal adenopathy. Abdominal aorta is normal in caliber. Reproductive: Uterus and adnexa normal for age. Bladder: Decompressed, no bladder stone. Other: No free air, free fluid, or intra-abdominal fluid collection. Musculoskeletal: There are no acute or suspicious osseous abnormalities. IMPRESSION: Uncomplicated acute appendicitis.  No perforation or abscess. Electronically Signed   By: Jeb Levering M.D.   On: 06/13/2015 00:38    Review of Systems  Constitutional: Negative for fever and chills.  HENT: Negative for congestion.   Respiratory: Negative for shortness of breath.   Cardiovascular: Negative for chest pain.  Gastrointestinal: Positive for abdominal pain and diarrhea. Negative for nausea, vomiting and blood in stool.  Genitourinary: Negative for dysuria and hematuria.  Neurological: Negative for focal weakness.    Blood pressure 112/74, pulse 70, temperature 98.4 F (36.9 C), temperature source Oral, resp. rate 15, height '5\' 5"'$  (1.651 m), weight 91.627 kg (202 lb), last menstrual period 06/06/2015, SpO2 95 %. Physical Exam  Constitutional:  Overweight female in no acute  distress.  HENT:  Head: Normocephalic and atraumatic.  Eyes: No scleral icterus.  Cardiovascular: Normal rate and regular rhythm.   Respiratory: Effort normal and breath sounds normal.  GI: Soft. She exhibits no mass. There is tenderness (right lower quadrant). There is guarding (right lower quadrant).  Musculoskeletal: She exhibits no edema.  Neurological: She is alert.  Skin: Skin is warm and dry.  Psychiatric: She has a normal mood and affect. Her behavior is normal.     Assessment/Plan Acute appendicitis.  Plan: 1 dose of IV Rocephin. Laparoscopic possible open appendectomy. I have discussed the procedure and risks of appendectomy. The risks include but are not limited to bleeding, infection, wound problems, anesthesia, injury to intra-abdominal organs, possibility of postoperative ileus. She seems to understand and agrees with the plan.  Odis Hollingshead, MD 06/13/2015, 4:10  AM    

## 2015-06-13 NOTE — Anesthesia Preprocedure Evaluation (Signed)
Anesthesia Evaluation  Patient identified by MRN, date of birth, ID band Patient awake    Reviewed: Allergy & Precautions, NPO status , Patient's Chart, lab work & pertinent test results  Airway Mallampati: II   Neck ROM: full    Dental   Pulmonary neg pulmonary ROS,    breath sounds clear to auscultation       Cardiovascular negative cardio ROS   Rhythm:regular Rate:Normal     Neuro/Psych  Headaches,    GI/Hepatic   Endo/Other  obese  Renal/GU      Musculoskeletal   Abdominal   Peds  Hematology   Anesthesia Other Findings   Reproductive/Obstetrics                             Anesthesia Physical Anesthesia Plan  ASA: II  Anesthesia Plan: General   Post-op Pain Management:    Induction: Intravenous  Airway Management Planned: Oral ETT  Additional Equipment:   Intra-op Plan:   Post-operative Plan: Extubation in OR  Informed Consent: I have reviewed the patients History and Physical, chart, labs and discussed the procedure including the risks, benefits and alternatives for the proposed anesthesia with the patient or authorized representative who has indicated his/her understanding and acceptance.     Plan Discussed with: CRNA, Anesthesiologist and Surgeon  Anesthesia Plan Comments:         Anesthesia Quick Evaluation

## 2015-06-13 NOTE — Transfer of Care (Signed)
Immediate Anesthesia Transfer of Care Note  Patient: Heather House  Procedure(s) Performed: Procedure(s): APPENDECTOMY LAPAROSCOPIC (N/A)  Patient Location: PACU  Anesthesia Type:General  Level of Consciousness: sedated  Airway & Oxygen Therapy: Patient Spontanous Breathing and Patient connected to face mask oxygen  Post-op Assessment: Report given to RN and Post -op Vital signs reviewed and stable  Post vital signs: Reviewed and stable  Last Vitals:  Filed Vitals:   06/13/15 0605 06/13/15 0622  BP: 107/58 113/68  Pulse: 71 67  Temp: 36.9 C 36.8 C  Resp: 13 16    Complications: No apparent anesthesia complications

## 2015-06-13 NOTE — Progress Notes (Signed)
Patient ID: Heather House, female   DOB: 1981-04-20, 35 y.o.   MRN: [id number]    Subjective: still with right lower quadrant pain.  Objective: Vital signs in last 24 hours: Temp:  [98.3 F (36.8 C)-99.3 F (37.4 C)] 98.3 F (36.8 C) (02/20 0622) Pulse Rate:  [64-91] 67 (02/20 0622) Resp:  [13-20] 16 (02/20 0622) BP: (104-145)/(58-96) 113/68 mmHg (02/20 0622) SpO2:  [95 %-100 %] 100 % (02/20 0622) Weight:  [91.627 kg (202 lb)-96 kg (211 lb 10.3 oz)] 96 kg (211 lb 10.3 oz) (02/20 0622) Last BM Date: 06/12/15  Intake/Output from previous day:   Intake/Output this shift:    General appearance: alert, cooperative and no distress GI: abnormal findings:  moderate tenderness in the RLQ and with guarding  Lab Results:   Recent Labs  06/12/15 2330  WBC 17.2*  HGB 10.9*  HCT 34.5*  PLT 332   BMET  Recent Labs  06/12/15 2330  NA 139  K 3.7  CL 105  CO2 27  GLUCOSE 94  BUN 22*  CREATININE 0.87  CALCIUM 8.5*     Studies/Results: Ct Renal Stone Study  06/13/2015  CLINICAL DATA:  Right flank/right lower quadrant pain radiating to the epigastrium. EXAM: CT ABDOMEN AND PELVIS WITHOUT CONTRAST TECHNIQUE: Multidetector CT imaging of the abdomen and pelvis was performed following the standard protocol without IV contrast. COMPARISON:  None. FINDINGS: Lower chest:  The included lung bases are clear. Liver: Borderline hepatic steatosis. No focal allowing for lack contrast lesion. Hepatobiliary: Gallbladder physiologically distended, no calcified stone. No biliary dilatation. Pancreas: No ductal dilatation or inflammation. Spleen: Normal. Adrenal glands: No nodule. Kidneys: Symmetric in size without stones or hydronephrosis. There is no perinephric stranding. Both ureters are decompressed without stones along the course. Stomach/Bowel: Stomach physiologically distended. There are no dilated or thickened small bowel loops. Small-moderate volume of stool throughout the colon without colonic  wall thickening. Appendix: Dilated measuring 9 mm with mild periappendiceal inflammatory change. No perforation or abscess. Vascular/Lymphatic: No retroperitoneal adenopathy. Abdominal aorta is normal in caliber. Reproductive: Uterus and adnexa normal for age. Bladder: Decompressed, no bladder stone. Other: No free air, free fluid, or intra-abdominal fluid collection. Musculoskeletal: There are no acute or suspicious osseous abnormalities. IMPRESSION: Uncomplicated acute appendicitis.  No perforation or abscess. Electronically Signed   By: Rubye Oaks M.D.   On: 06/13/2015 00:38    Anti-infectives: Anti-infectives    Start     Dose/Rate Route Frequency Ordered Stop   06/13/15 0415  cefTRIAXone (ROCEPHIN) 1 g in dextrose 5 % 50 mL IVPB  Status:  Discontinued     1 g 100 mL/hr over 30 Minutes Intravenous  Once 06/13/15 0414 06/13/15 0612   06/13/15 0145  cefTRIAXone (ROCEPHIN) 1 g in dextrose 5 % 50 mL IVPB     1 g 100 mL/hr over 30 Minutes Intravenous  Once 06/13/15 0135 06/13/15 0425   06/13/15 0145  metroNIDAZOLE (FLAGYL) IVPB 500 mg     500 mg 100 mL/hr over 60 Minutes Intravenous  Once 06/13/15 0135 06/13/15 0425   06/13/15 0115  piperacillin-tazobactam (ZOSYN) IVPB 3.375 g     3.375 g 100 mL/hr over 30 Minutes Intravenous  Once 06/13/15 0105 06/13/15 0249      Assessment/Plan: Apparent acute appendicitis with typical symptoms and physical exam,elevated WBC, and uncomplicated appendicitis seen on CT scan I recommended proceeding with laparoscopic appendectomy. We discussed the indications for the procedure and its nature and expected recovery as well as risks of  anesthetic complications, bleeding and infection.All her questions answered.      Renezmae Canlas T 06/13/2015

## 2015-06-13 NOTE — Anesthesia Procedure Notes (Signed)
Procedure Name: Intubation Date/Time: 06/13/2015 9:06 AM Performed by: Doran Clay Pre-anesthesia Checklist: Patient identified, Timeout performed, Suction available, Patient being monitored and Emergency Drugs available Patient Re-evaluated:Patient Re-evaluated prior to inductionOxygen Delivery Method: Circle system utilized Preoxygenation: Pre-oxygenation with 100% oxygen Intubation Type: IV induction Laryngoscope Size: Mac and 3 Grade View: Grade I Tube type: Oral Tube size: 7.0 mm Number of attempts: 1 Airway Equipment and Method: Stylet Placement Confirmation: ETT inserted through vocal cords under direct vision,  breath sounds checked- equal and bilateral and positive ETCO2 Secured at: 21 cm Tube secured with: Tape Dental Injury: Teeth and Oropharynx as per pre-operative assessment

## 2015-06-14 MED ORDER — HYDROCODONE-ACETAMINOPHEN 5-325 MG PO TABS: 1.0000 | ORAL_TABLET | ORAL | Status: DC | PRN

## 2015-06-14 NOTE — Discharge Instructions (Signed)
la 

## 2015-06-14 NOTE — Discharge Summary (Signed)
Physician Discharge Summary  Patient ID: Heather House MRN: [id number] DOB/AGE: [birth year] 37 y.o.  Admit date: 06/12/2015 Discharge date: 06/14/2015  Admitting Diagnosis: Acute appendicitis   Discharge Diagnosis Patient Active Problem List   Diagnosis Date Noted  . Acute appendicitis 06/13/2015  . CONJUNCTIVITIS, ACUTE, LEFT 07/27/2008  . COMMON MIGRAINE 05/04/2008  . URI 02/20/2007  . PALPITATIONS 01/01/2007  . ABFND, PAP SMEAR, CERVIX/CERVICAL HPV NEC 09/02/2006    Consultants none  Imaging: Ct Renal Stone Study  06/13/2015  CLINICAL DATA:  Right flank/right lower quadrant pain radiating to the epigastrium. EXAM: CT ABDOMEN AND PELVIS WITHOUT CONTRAST TECHNIQUE: Multidetector CT imaging of the abdomen and pelvis was performed following the standard protocol without IV contrast. COMPARISON:  None. FINDINGS: Lower chest:  The included lung bases are clear. Liver: Borderline hepatic steatosis. No focal allowing for lack contrast lesion. Hepatobiliary: Gallbladder physiologically distended, no calcified stone. No biliary dilatation. Pancreas: No ductal dilatation or inflammation. Spleen: Normal. Adrenal glands: No nodule. Kidneys: Symmetric in size without stones or hydronephrosis. There is no perinephric stranding. Both ureters are decompressed without stones along the course. Stomach/Bowel: Stomach physiologically distended. There are no dilated or thickened small bowel loops. Small-moderate volume of stool throughout the colon without colonic wall thickening. Appendix: Dilated measuring 9 mm with mild periappendiceal inflammatory change. No perforation or abscess. Vascular/Lymphatic: No retroperitoneal adenopathy. Abdominal aorta is normal in caliber. Reproductive: Uterus and adnexa normal for age. Bladder: Decompressed, no bladder stone. Other: No free air, free fluid, or intra-abdominal fluid collection. Musculoskeletal: There are no acute or suspicious osseous abnormalities. IMPRESSION:  Uncomplicated acute appendicitis.  No perforation or abscess. Electronically Signed   By: Rubye Oaks M.D.   On: 06/13/2015 00:38    Procedures Laparoscopic appendectomy---Dr. [facility] Course:  Heather House is a healthy 35 year old female who presented to [facility] with RLQ abdominal pain.  Workup showed acute appendicitis and leukocytosis.  Patient was admitted and underwent procedure listed above.  Tolerated procedure well and was transferred to the floor.  Diet was advanced as tolerated.  On POD#1, the patient was voiding well, tolerating diet, ambulating well, pain well controlled, vital signs stable, incisions c/d/i and felt stable for discharge home.  Medication risks, benefits and therapeutic alternatives were reviewed with the patient.  She verbalizes understanding.   Patient will follow up in our office in 3 weeks and knows to call with questions or concerns.  Physical Exam: General:  Alert, NAD, pleasant, comfortable Abd:  Soft, ND, mild tenderness, incisions C/D/I    Medication List    TAKE these medications        fluticasone 50 MCG/ACT nasal spray  Commonly known as:  FLONASE  Place 1-2 sprays into both nostrils daily as needed for allergies or rhinitis.     GLUCOSAMINE PO  Take 1 tablet by mouth daily.     HYDROcodone-acetaminophen 5-325 MG tablet  Commonly known as:  NORCO/VICODIN  Take 1-2 tablets by mouth every 4 (four) hours as needed for moderate pain.     multivitamin with minerals Tabs tablet  Take 1 tablet by mouth daily. Nutrilite Double X     OVER THE COUNTER MEDICATION  Take 1 tablet by mouth daily. Nutrilite Iron Folic             Follow-up Information    Follow up with [facility] On 07/06/2015.   Specialty:  General Surgery   Why:  arrive by 8:30AM for a 9AM post op check  Contact information:   [address] [place] [address] [phone]       Signed: Ashok Norris, [facility]  Surgery [phone]  06/14/2015, 9:35 AM

## 2017-05-16 ENCOUNTER — Encounter (INDEPENDENT_AMBULATORY_CARE_PROVIDER_SITE_OTHER): Payer: Self-pay | Admitting: Orthopedic Surgery

## 2017-05-16 ENCOUNTER — Ambulatory Visit (INDEPENDENT_AMBULATORY_CARE_PROVIDER_SITE_OTHER): Payer: Self-pay

## 2017-05-16 ENCOUNTER — Ambulatory Visit (INDEPENDENT_AMBULATORY_CARE_PROVIDER_SITE_OTHER): Payer: Self-pay | Admitting: Orthopedic Surgery

## 2017-05-16 DIAGNOSIS — M25562 Pain in left knee: Secondary | ICD-10-CM

## 2017-05-17 ENCOUNTER — Encounter (INDEPENDENT_AMBULATORY_CARE_PROVIDER_SITE_OTHER): Payer: Self-pay | Admitting: Orthopedic Surgery

## 2017-05-17 NOTE — Progress Notes (Signed)
Office Visit Note   Patient: Heather House           Date of Birth: 06/01/1980           MRN: [id number] Visit Date: 05/16/2017 Requested by: Willow OraAndy, Camille L, MD [address] [place], [place] [id number] PCP: Willow OraAndy, Camille L, MD  Subjective: Chief Complaint  Patient presents with  . Left Knee - Pain    HPI: Heather DusterMichelle is a patient with left knee pain.  She states the left knee pain started yesterday.  Does fitness type of workout called 9 rounds which includes kickboxing.  Stated that her left knee started swelling several days ago.  Is not having any pain in the knee.  She states it is hard for her to bend her knee back.  She iced the knee last night.  Denies any mechanical symptoms.  Took Advil for pain.              ROS: All systems reviewed are negative as they relate to the chief complaint within the history of present illness.  Patient denies  fevers or chills.   Assessment & Plan: Visit Diagnoses:  1. Acute pain of left knee     Plan: Impression is left knee pain with patellofemoral crepitus and effusion.  This likely represents overuse reaction to a workout routine which is not particularly well suited for what appears to be likely patellofemoral wear and tear in this patient.  She adjusts her workouts is taking anti-inflammatories that should be a self-limited problem.  I would favor aspiration and injection as a next step followed by MRI scan if that does not help.  She will adjust her workouts continue with anti-inflammatories and if her symptoms do not improve she will come back for further management.  Follow-Up Instructions: Return if symptoms worsen or fail to improve.   Orders:  Orders Placed This Encounter  Procedures  . XR KNEE 3 VIEW LEFT   No orders of the defined types were placed in this encounter.     Procedures: No procedures performed   Clinical Data: No additional findings.  Objective: Vital Signs: There were no vitals taken for this  visit.  Physical Exam:   Constitutional: Patient appears well-developed HEENT:  Head: Normocephalic Eyes:EOM are normal Neck: Normal range of motion Cardiovascular: Normal rate Pulmonary/chest: Effort normal Neurologic: Patient is alert Skin: Skin is warm Psychiatric: Patient has normal mood and affect    Ortho Exam: Let us effusion in the left knee which is mild compared to the right.  Collateral and cruciate ligaments are stable.  There is patellofemoral crepitus bilaterally.  Pedal pulses palpable.  No focal joint line tenderness is present.  No groin pain with internal/external rotation of the leg.  Specialty Comments:  No specialty comments available.  Imaging: No results found.   PMFS History: Patient Active Problem List   Diagnosis Date Noted  . Acute appendicitis 06/13/2015  . CONJUNCTIVITIS, ACUTE, LEFT 07/27/2008  . COMMON MIGRAINE 05/04/2008  . URI 02/20/2007  . PALPITATIONS 01/01/2007  . ABFND, PAP SMEAR, CERVIX/CERVICAL HPV NEC 09/02/2006   History reviewed. No pertinent past medical history.  History reviewed. No pertinent family history.  Past Surgical History:  Procedure Laterality Date  . LAPAROSCOPIC APPENDECTOMY N/A 06/13/2015   Procedure: APPENDECTOMY LAPAROSCOPIC;  Surgeon: Glenna FellowsBenjamin Hoxworth, MD;  Location: [facility];  Service: General;  Laterality: N/A;   Social History   Occupational History  . Not on file  Tobacco Use  .  Smoking status: Never Smoker  Substance and Sexual Activity  . Alcohol use: No  . Drug use: Not on file  . Sexual activity: Not on file

## 2017-05-20 ENCOUNTER — Telehealth: Payer: Self-pay | Admitting: Family Medicine

## 2017-05-20 NOTE — Telephone Encounter (Signed)
Called and spoke with pt to advise her that she would need to reschedule her appt with Dr. Katrinka BlazingSmith. Dr. Katrinka BlazingSmith is no longer accepting new patients at this time. When pt calls back, please reschedule her with a provider of her choice - someone besides Dr. Katrinka BlazingSmith.  Thanks!

## 2017-05-27 ENCOUNTER — Ambulatory Visit (INDEPENDENT_AMBULATORY_CARE_PROVIDER_SITE_OTHER): Payer: Self-pay | Admitting: Orthopedic Surgery

## 2017-05-27 ENCOUNTER — Encounter (INDEPENDENT_AMBULATORY_CARE_PROVIDER_SITE_OTHER): Payer: Self-pay | Admitting: Orthopedic Surgery

## 2017-05-27 DIAGNOSIS — G8929 Other chronic pain: Secondary | ICD-10-CM

## 2017-05-27 DIAGNOSIS — M25562 Pain in left knee: Principal | ICD-10-CM

## 2017-05-29 ENCOUNTER — Encounter (INDEPENDENT_AMBULATORY_CARE_PROVIDER_SITE_OTHER): Payer: Self-pay | Admitting: Orthopedic Surgery

## 2017-05-29 DIAGNOSIS — G8929 Other chronic pain: Secondary | ICD-10-CM

## 2017-05-29 DIAGNOSIS — M25562 Pain in left knee: Secondary | ICD-10-CM

## 2017-05-29 MED ORDER — METHYLPREDNISOLONE ACETATE 40 MG/ML IJ SUSP
40.0000 mg | INTRAMUSCULAR | Status: AC | PRN
  Administered 2017-05-29: 40 mg via INTRA_ARTICULAR

## 2017-05-29 MED ORDER — BUPIVACAINE HCL 0.5 % IJ SOLN
9.0000 mL | INTRAMUSCULAR | Status: AC | PRN
  Administered 2017-05-29: 9 mL via INTRA_ARTICULAR

## 2017-05-29 MED ORDER — LIDOCAINE HCL 1 % IJ SOLN
5.0000 mL | INTRAMUSCULAR | Status: AC | PRN
  Administered 2017-05-29: 5 mL

## 2017-05-29 NOTE — Progress Notes (Signed)
Office Visit Note   Patient: Heather House           Date of Birth: [birth year]           MRN: [id number] Visit Date: 05/27/2017 Requested by: No referring provider defined for this encounter. PCP: Patient, No Pcp Per  Subjective: Chief Complaint  Patient presents with  . Left Knee - Follow-up    HPI: Heather House is a patient with left knee pain.  She is here for follow-up.  She states her left knee is still very painful and swollen following a very aggressive 9 round workout several weeks ago.  She is tried icing.  She had an effusion last clinic visit.  That effusion has persisted.  She describes mechanical sources and grinding in her left knee.  She has difficulty going up and down stairs.  She has been taking over-the-counter medication ibuprofen which has not helped.              ROS: All systems reviewed are negative as they relate to the chief complaint within the history of present illness.  Patient denies  fevers or chills.   Assessment & Plan: Visit Diagnoses:  1. Chronic pain of left knee     Plan: Impression is left knee pain with coarse grinding and patellofemoral crepitus and persistent effusion which is gotten larger.  She has had failure of conservative treatment.  Plan is to aspirate and inject the left knee today and she needs MRI of the left knee to evaluate for patellofemoral chondral damage versus some other source of left knee effusion  Follow-Up Instructions: Return for after MRI.   Orders:  Orders Placed This Encounter  Procedures  . MR Knee Left w/o contrast   No orders of the defined types were placed in this encounter.     Procedures: Large Joint Inj: L knee on 05/29/2017 9:40 AM Indications: diagnostic evaluation and pain Details: 18 G 1.5 in needle, posterior approach  Arthrogram: No  Medications: 9 mL bupivacaine 0.5 %; 40 mg methylPREDNISolone acetate 40 MG/ML; 5 mL lidocaine 1 % Aspirate: 40 mL yellow Outcome: tolerated well, no  immediate complications Procedure, treatment alternatives, risks and benefits explained, specific risks discussed. Consent was given by the patient. Immediately prior to procedure a time out was called to verify the correct patient, procedure, equipment, support staff and site/side marked as required. Patient was prepped and draped in the usual sterile fashion.       Clinical Data: No additional findings.  Objective: Vital Signs: There were no vitals taken for this visit.  Physical Exam:   Constitutional: Patient appears well-developed HEENT:  Head: Normocephalic Eyes:EOM are normal Neck: Normal range of motion Cardiovascular: Normal rate Pulmonary/chest: Effort normal Neurologic: Patient is alert Skin: Skin is warm Psychiatric: Patient has normal mood and affect    Ortho Exam: Orthopedic exam demonstrates moderate left knee effusion with stable collateral cruciate ligaments more patellofemoral crepitus present on the left compared to the right.  No focal joint line tenderness with negative McMurray compression testing.  Extensor mechanism is intact and nontender.  Pedal pulses palpable.  No other masses lymphadenopathy or skin changes noted in the left knee region  Specialty Comments:  No specialty comments available.  Imaging: No results found.   PMFS History: Patient Active Problem List   Diagnosis Date Noted  . Acute appendicitis 06/13/2015  . CONJUNCTIVITIS, ACUTE, LEFT 07/27/2008  . COMMON MIGRAINE 05/04/2008  . URI 02/20/2007  . PALPITATIONS 01/01/2007  .  ABFND, PAP SMEAR, CERVIX/CERVICAL HPV NEC 09/02/2006   History reviewed. No pertinent past medical history.  History reviewed. No pertinent family history.  Past Surgical History:  Procedure Laterality Date  . LAPAROSCOPIC APPENDECTOMY N/A 06/13/2015   Procedure: APPENDECTOMY LAPAROSCOPIC;  Surgeon: Glenna FellowsBenjamin Hoxworth, MD;  Location: [facility];  Service: General;  Laterality: N/A;   Social History    Occupational History  . Not on file  Tobacco Use  . Smoking status: Never Smoker  . Smokeless tobacco: Never Used  Substance and Sexual Activity  . Alcohol use: No  . Drug use: Not on file  . Sexual activity: Not on file

## 2017-06-02 ENCOUNTER — Ambulatory Visit
Admission: RE | Admit: 2017-06-02 | Discharge: 2017-06-02 | Disposition: A | Discharge status: Home / Self Care | Payer: PRIVATE HEALTH INSURANCE | Source: Ambulatory Visit | Attending: Orthopedic Surgery | Admitting: Orthopedic Surgery

## 2017-06-02 DIAGNOSIS — G8929 Other chronic pain: Secondary | ICD-10-CM

## 2017-06-02 DIAGNOSIS — M25562 Pain in left knee: Principal | ICD-10-CM

## 2017-06-06 ENCOUNTER — Encounter (INDEPENDENT_AMBULATORY_CARE_PROVIDER_SITE_OTHER): Payer: Self-pay | Admitting: Orthopedic Surgery

## 2017-06-06 ENCOUNTER — Ambulatory Visit (INDEPENDENT_AMBULATORY_CARE_PROVIDER_SITE_OTHER): Payer: Self-pay | Admitting: Orthopedic Surgery

## 2017-06-06 DIAGNOSIS — M1712 Unilateral primary osteoarthritis, left knee: Secondary | ICD-10-CM

## 2017-06-08 ENCOUNTER — Encounter (INDEPENDENT_AMBULATORY_CARE_PROVIDER_SITE_OTHER): Payer: Self-pay | Admitting: Orthopedic Surgery

## 2017-06-08 NOTE — Progress Notes (Signed)
Office Visit Note   Patient: Ovidio HangerRobin [name] Peel           Date of Birth: 12/25/1980           MRN: [id number] Visit Date: 06/06/2017 Requested by: No referring provider defined for this encounter. PCP: Patient, No Pcp Per  Subjective: Chief Complaint  Patient presents with  . Left Knee - Follow-up    HPI: Elnita MaxwellCheryl is a patient with left knee pain.  Since I have seen her she has had an MRI scan of the left knee.  She did have an effusion in the past.  MRI scan is reviewed and it shows mild patellofemoral arthritis but structurally the medial and lateral compartments menisci and ligaments are intact.  States her knee is feeling a little bit better but she has not been back to that type of workout which aggravated the knee.              ROS: All systems reviewed are negative as they relate to the chief complaint within the history of present illness.  Patient denies  fevers or chills.   Assessment & Plan: Visit Diagnoses:  1. Patellofemoral arthritis of left knee     Plan: Pression is left knee pain.  Patellofemoral arthritis is present.  No effusion today.  I do not see a definite operative indication in the knee at this time.  Activity modification and anti-inflammatories should keep this out of the surgical realm.  She does have a very small chondral flap on the back surface of that patella which could be addressed if needed but for now with no effusion and improving clinical symptoms I would observe.  Follow-Up Instructions: Return if symptoms worsen or fail to improve.   Orders:  No orders of the defined types were placed in this encounter.  No orders of the defined types were placed in this encounter.     Procedures: No procedures performed   Clinical Data: No additional findings.  Objective: Vital Signs: There were no vitals taken for this visit.  Physical Exam:   Constitutional: Patient appears well-developed HEENT:  Head: Normocephalic Eyes:EOM are  normal Neck: Normal range of motion Cardiovascular: Normal rate Pulmonary/chest: Effort normal Neurologic: Patient is alert Skin: Skin is warm Psychiatric: Patient has normal mood and affect    Ortho Exam: Orthopedic exam demonstrates reasonable range of motion but with more patellofemoral crepitus on the left compared to the right.  No effusion in the knee today on either side.  Collateral and cruciate ligaments are stable.  Extensor mechanism is intact.  Nontender.  Range of motion is full.  Pedal pulses palpable.  Specialty Comments:  No specialty comments available.  Imaging: No results found.   PMFS History: Patient Active Problem List   Diagnosis Date Noted  . Acute appendicitis 06/13/2015  . CONJUNCTIVITIS, ACUTE, LEFT 07/27/2008  . COMMON MIGRAINE 05/04/2008  . URI 02/20/2007  . PALPITATIONS 01/01/2007  . ABFND, PAP SMEAR, CERVIX/CERVICAL HPV NEC 09/02/2006   History reviewed. No pertinent past medical history.  History reviewed. No pertinent family history.  Past Surgical History:  Procedure Laterality Date  . LAPAROSCOPIC APPENDECTOMY N/A 06/13/2015   Procedure: APPENDECTOMY LAPAROSCOPIC;  Surgeon: Glenna FellowsBenjamin Hoxworth, MD;  Location: [facility];  Service: General;  Laterality: N/A;   Social History   Occupational History  . Not on file  Tobacco Use  . Smoking status: Never Smoker  . Smokeless tobacco: Never Used  Substance and Sexual Activity  . Alcohol use: No  .  Drug use: Not on file  . Sexual activity: Not on file

## 2017-07-08 ENCOUNTER — Ambulatory Visit: Payer: Self-pay | Admitting: Family Medicine

## 2017-09-12 ENCOUNTER — Encounter: Payer: Self-pay | Admitting: Family Medicine

## 2017-10-03 ENCOUNTER — Ambulatory Visit (INDEPENDENT_AMBULATORY_CARE_PROVIDER_SITE_OTHER): Payer: Managed Care, Other (non HMO) | Admitting: Orthopedic Surgery

## 2017-10-03 ENCOUNTER — Encounter (INDEPENDENT_AMBULATORY_CARE_PROVIDER_SITE_OTHER): Payer: Self-pay | Admitting: Orthopedic Surgery

## 2017-10-03 DIAGNOSIS — M1712 Unilateral primary osteoarthritis, left knee: Secondary | ICD-10-CM

## 2017-10-05 ENCOUNTER — Encounter (INDEPENDENT_AMBULATORY_CARE_PROVIDER_SITE_OTHER): Payer: Self-pay | Admitting: Orthopedic Surgery

## 2017-10-05 DIAGNOSIS — M1712 Unilateral primary osteoarthritis, left knee: Secondary | ICD-10-CM

## 2017-10-05 MED ORDER — BUPIVACAINE HCL 0.25 % IJ SOLN
4.0000 mL | INTRAMUSCULAR | Status: AC | PRN
  Administered 2017-10-05: 4 mL via INTRA_ARTICULAR

## 2017-10-05 MED ORDER — LIDOCAINE HCL 1 % IJ SOLN
5.0000 mL | INTRAMUSCULAR | Status: AC | PRN
  Administered 2017-10-05: 5 mL

## 2017-10-05 MED ORDER — METHYLPREDNISOLONE ACETATE 40 MG/ML IJ SUSP
40.0000 mg | INTRAMUSCULAR | Status: AC | PRN
  Administered 2017-10-05: 40 mg via INTRA_ARTICULAR

## 2017-10-05 NOTE — Progress Notes (Signed)
Office Visit Note   Patient: Heather House           Date of Birth: [birth year]           MRN: [id number] Visit Date: 10/03/2017 Requested by: No referring provider defined for this encounter. PCP: Patient, No Pcp Per  Subjective: Chief Complaint  Patient presents with  . Left Knee - Pain    HPI: Heather House is a patient with left knee pain.  She reports recurrent swelling in the left knee for the last week.  She did do a lot of walking recently on a trip to [place].  Previous aspiration and injection in February helped.  She has been doing a lot of stair walking.  She has known significant patellofemoral arthritis.  Ibuprofen helps.              ROS: All systems reviewed are negative as they relate to the chief complaint within the history of present illness.  Patient denies  fevers or chills.   Assessment & Plan: Visit Diagnoses:  1. Patellofemoral arthritis of left knee     Plan: Impression is left knee arthritis with some pain with previous help with an injection.  Plan is repeat aspiration and injection today.  I will see her back as needed.  Continue with quad strengthening exercises.  Follow-Up Instructions: Return if symptoms worsen or fail to improve.   Orders:  No orders of the defined types were placed in this encounter.  No orders of the defined types were placed in this encounter.     Procedures: Large Joint Inj: L knee on 10/05/2017 8:47 AM Indications: diagnostic evaluation, joint swelling and pain Details: 18 G 1.5 in needle, superolateral approach  Arthrogram: No  Medications: 5 mL lidocaine 1 %; 40 mg methylPREDNISolone acetate 40 MG/ML; 4 mL bupivacaine 0.25 % Outcome: tolerated well, no immediate complications Procedure, treatment alternatives, risks and benefits explained, specific risks discussed. Consent was given by the patient. Immediately prior to procedure a time out was called to verify the correct patient, procedure, equipment, support  staff and site/side marked as required. Patient was prepped and draped in the usual sterile fashion.       Clinical Data: No additional findings.  Objective: Vital Signs: There were no vitals taken for this visit.  Physical Exam:   Constitutional: Patient appears well-developed HEENT:  Head: Normocephalic Eyes:EOM are normal Neck: Normal range of motion Cardiovascular: Normal rate Pulmonary/chest: Effort normal Neurologic: Patient is alert Skin: Skin is warm Psychiatric: Patient has normal mood and affect    Ortho Exam: Orthopedic exam demonstrates normal gait alignment.  She does have some patellofemoral crepitus on the left with not much in the way of effusion in the left knee or right knee.  Negative patellar apprehension.  Collateral and cruciate ligaments are stable.  Pedal pulses palpable.  No other masses lymph adenopathy or skin changes noted in that left knee region.  Specialty Comments:  No specialty comments available.  Imaging: No results found.   PMFS History: Patient Active Problem List   Diagnosis Date Noted  . Acute appendicitis 06/13/2015  . CONJUNCTIVITIS, ACUTE, LEFT 07/27/2008  . COMMON MIGRAINE 05/04/2008  . URI 02/20/2007  . PALPITATIONS 01/01/2007  . ABFND, PAP SMEAR, CERVIX/CERVICAL HPV NEC 09/02/2006   History reviewed. No pertinent past medical history.  History reviewed. No pertinent family history.  Past Surgical History:  Procedure Laterality Date  . LAPAROSCOPIC APPENDECTOMY N/A 06/13/2015   Procedure: APPENDECTOMY LAPAROSCOPIC;  Surgeon: Sharlet Salina  Hoxworth, MD;  Location: [facility];  Service: General;  Laterality: N/A;   Social History   Occupational History  . Not on file  Tobacco Use  . Smoking status: Never Smoker  . Smokeless tobacco: Never Used  Substance and Sexual Activity  . Alcohol use: No  . Drug use: Not on file  . Sexual activity: Not on file

## 2017-10-07 ENCOUNTER — Ambulatory Visit (INDEPENDENT_AMBULATORY_CARE_PROVIDER_SITE_OTHER): Payer: Self-pay | Admitting: Orthopedic Surgery

## 2018-06-02 ENCOUNTER — Emergency Department (HOSPITAL_BASED_OUTPATIENT_CLINIC_OR_DEPARTMENT_OTHER)
Admission: EM | Admit: 2018-06-02 | Discharge: 2018-06-02 | Disposition: A | Discharge status: Home / Self Care | Payer: Managed Care, Other (non HMO) | Attending: Emergency Medicine | Admitting: Emergency Medicine

## 2018-06-02 ENCOUNTER — Encounter (HOSPITAL_BASED_OUTPATIENT_CLINIC_OR_DEPARTMENT_OTHER): Payer: Self-pay | Admitting: *Deleted

## 2018-06-02 ENCOUNTER — Other Ambulatory Visit: Payer: Self-pay

## 2018-06-02 ENCOUNTER — Emergency Department (HOSPITAL_BASED_OUTPATIENT_CLINIC_OR_DEPARTMENT_OTHER): Payer: Managed Care, Other (non HMO)

## 2018-06-02 DIAGNOSIS — M79601 Pain in right arm: Secondary | ICD-10-CM

## 2018-06-02 DIAGNOSIS — Z79899 Other long term (current) drug therapy: Secondary | ICD-10-CM | POA: Diagnosis not present

## 2018-06-02 DIAGNOSIS — R52 Pain, unspecified: Secondary | ICD-10-CM

## 2018-06-02 NOTE — ED Provider Notes (Signed)
[facility] EMERGENCY DEPARTMENT Provider Note   CSN: [id number] Arrival date & time: 06/02/18  1833     History   Chief Complaint Chief Complaint  Patient presents with  . Arm Pain    HPI Heather ALOIA is a 38 y.o. female who presents with right arm pain.  Patient states that for the past 2 months she has had gradually worsening pain in her entire right arm.  Goes from her shoulder area to her fingers.  It feels like a deep ache. she is worried about a circulation issue because it feels cold in the fingers sometimes.  She went to orthopedics and was told that she has tennis elbow.  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  She denies sharp shooting pain, numbness or tingling or weakness of the arm or hand.  She denies any neck pain.  No swelling or ecchymosis of the arm.  She never had a DVT before.  HPI  History reviewed. No pertinent past medical history.  Patient Active Problem List   Diagnosis Date Noted  . Acute appendicitis 06/13/2015  . CONJUNCTIVITIS, ACUTE, LEFT 07/27/2008  . COMMON MIGRAINE 05/04/2008  . URI 02/20/2007  . PALPITATIONS 01/01/2007  . ABFND, PAP SMEAR, CERVIX/CERVICAL HPV NEC 09/02/2006    Past Surgical History:  Procedure Laterality Date  . LAPAROSCOPIC APPENDECTOMY N/A 06/13/2015   Procedure: APPENDECTOMY LAPAROSCOPIC;  Surgeon: Glenna Fellows, MD;  Location: [facility];  Service: General;  Laterality: N/A;     OB History   No obstetric history on file.      Home Medications    Prior to Admission medications   Medication Sig Start Date End Date Taking? Authorizing Provider  Glucosamine HCl (GLUCOSAMINE PO) Take 1 tablet by mouth daily.   Yes [provider]  Multiple Vitamin (MULTIVITAMIN WITH MINERALS) TABS tablet Take 1 tablet by mouth daily. Nutrilite Double X   Yes [provider]  fluticasone (FLONASE) 50 MCG/ACT nasal spray Place 1-2 sprays into both nostrils daily as needed for allergies or rhinitis.     [provider]  HYDROcodone-acetaminophen (NORCO/VICODIN) 5-325 MG tablet Take 1-2 tablets by mouth every 4 (four) hours as needed for moderate pain. 06/14/15   Riebock, Anette Riedel, NP  OVER THE COUNTER MEDICATION Take 1 tablet by mouth daily. Nutrilite Iron Folic    [provider]    Family History No family history on file.  Social History Social History   Tobacco Use  . Smoking status: Never Smoker  . Smokeless tobacco: Never Used  Substance Use Topics  . Alcohol use: No  . Drug use: Not on file     Allergies   Patient has no known allergies.   Review of Systems Review of Systems  Musculoskeletal: Positive for arthralgias and myalgias.  Skin: Negative for wound.  Neurological: Negative for weakness and numbness.     Physical Exam Updated Vital Signs BP (!) 131/92 (BP Location: Left Arm)   Pulse 74   Temp 98.7 F (37.1 C) (Oral)   Resp 20   Ht 5\' 5"  (1.651 m)   Wt 102.5 kg   LMP 05/26/2018   SpO2 98%   BMI 37.61 kg/m   Physical Exam Vitals signs and nursing note reviewed.  Constitutional:      General: She is not in acute distress.    Appearance: Normal appearance. She is well-developed.     Comments: Calm and cooperative.  HENT:     Head: Normocephalic and  atraumatic.  Eyes:     General: No scleral icterus.       Right eye: No discharge.        Left eye: No discharge.     Conjunctiva/sclera: Conjunctivae normal.     Pupils: Pupils are equal, round, and reactive to light.  Neck:     Musculoskeletal: Normal range of motion.  Cardiovascular:     Rate and Rhythm: Normal rate.  Pulmonary:     Effort: Pulmonary effort is normal. No respiratory distress.  Abdominal:     General: There is no distension.  Musculoskeletal:     Comments: Right upper extremity: No obvious swelling, deformity, or warmth. No tenderness of the shoulder, upper arm, elbow, forearm, hand. FROM of shoulder, elbow, wrist, fingers. 5/5 grip strength. N/V intact.     Skin:    General: Skin is warm and dry.  Neurological:     Mental Status: She is alert and oriented to person, place, and time.  Psychiatric:        Behavior: Behavior normal.      ED Treatments / Results  Labs (all labs ordered are listed, but only abnormal results are displayed) Labs Reviewed - No data to display  EKG None  Radiology US Venous Img Upper Uni Right  Result Date: 06/02/2018 CLINICAL DATA:  Right arm pain for 2 weeks EXAM: RIGHT UPPER EXTREMITY VENOUS DOPPLER ULTRASOUND TECHNIQUE: Gray-scale sonography with graded compression, as well as color Doppler and duplex ultrasound were performed to evaluate the upper extremity deep venous system from the level of the subclavian vein and including the jugular, axillary, basilic, radial, ulnar and upper cephalic vein. Spectral Doppler was utilized to evaluate flow at rest and with distal augmentation maneuvers. COMPARISON:  None. FINDINGS: Contralateral Subclavian Vein: Respiratory phasicity is normal and symmetric with the symptomatic side. No evidence of thrombus. Normal compressibility. Internal Jugular Vein: No evidence of thrombus. Normal compressibility, respiratory phasicity and response to augmentation. Subclavian Vein: No evidence of thrombus. Normal compressibility, respiratory phasicity and response to augmentation. Axillary Vein: No evidence of thrombus. Normal compressibility, respiratory phasicity and response to augmentation. Cephalic Vein: No evidence of thrombus. Normal compressibility, respiratory phasicity and response to augmentation. Basilic Vein: No evidence of thrombus. Normal compressibility, respiratory phasicity and response to augmentation. Brachial Veins: No evidence of thrombus. Normal compressibility, respiratory phasicity and response to augmentation. Radial Veins: No evidence of thrombus. Normal compressibility, respiratory phasicity and response to augmentation. Ulnar Veins: No evidence of thrombus. Normal  compressibility, respiratory phasicity and response to augmentation. Venous Reflux:  None visualized. Other Findings:  None visualized. IMPRESSION: No evidence of DVT within the right upper extremity. Electronically Signed   By: Alcide Clever M.D.   On: 06/02/2018 21:49    Procedures Procedures (including critical care time)  Medications Ordered in ED Medications - No data to display   Initial Impression / Assessment and Plan / ED Course  I have reviewed the triage vital signs and the nursing notes.  Pertinent labs & imaging results that were available during my care of the patient were reviewed by me and considered in my medical decision making (see chart for details).  38 year old female presents with right upper arm pain for several months. There is no obvious signs of bruising or swelling. Pain does not sound radicular and cannot be reproduced on exam. She has FROM of joints without pain. Her pulses are intact. Since her symptoms to not have an obvious etiology an [place] was obtained which  was negative for DVT. She was encouraged to f/u with ortho.  Final Clinical Impressions(s) / ED Diagnoses   Final diagnoses:  Pain  Right arm pain    ED Discharge Orders    None       Bethel BornGekas, Kelly Marie, PA-C 06/03/18 0038    Alvira MondaySchlossman, Erin, MD 06/07/18 [phone]

## 2018-06-02 NOTE — ED Triage Notes (Signed)
Right hand and arm pain x 2 months. No injury. She has seen an orthopedist and told it may be tennis elbow.

## 2018-06-02 NOTE — ED Notes (Signed)
Pt taken to US

## 2018-06-11 ENCOUNTER — Ambulatory Visit: Payer: Managed Care, Other (non HMO) | Admitting: Family Medicine

## 2018-06-11 ENCOUNTER — Encounter: Payer: Self-pay | Admitting: Family Medicine

## 2018-06-11 VITALS — BP 137/79 | HR 68 | Ht 65.0 in | Wt 226.0 lb

## 2018-06-11 DIAGNOSIS — M79601 Pain in right arm: Secondary | ICD-10-CM | POA: Diagnosis not present

## 2018-06-11 MED ORDER — PREDNISONE 10 MG PO TABS: ORAL_TABLET | ORAL | 0 refills | Status: AC

## 2018-06-11 NOTE — Patient Instructions (Signed)
Your exam is reassuring. This is consistent with an overuse peripheral neuropathy. Continue with the elbow brace, add wrist brace while working. Prednisone dose pack x 6 days - day AFTER this you can restart the meloxicam or try aleve twice a day with food. Do home exercises only once a day - wrist extensions, hammer rotations, stretches. Hold stretch for 20-30 seconds each direction 3 times.  Strengthening exercises are 3 sets of 10 once a day. Follow up with me in 1 month for reevaluation. If not improving let me know and we may have to limit your work hours - hopefully we won't have to do this.

## 2018-06-11 NOTE — Progress Notes (Signed)
PCP: Patient, No Pcp Per  Subjective:   HPI: Patient is a 38 y.o. female here for right arm pain.  Patient reports she's had about 2 months of pain in right arm. Started around her right wrist but has radiated since then to elbow and up to shoulder. Pain is a constant ache currently at 0/10 but up to 9/10 at times. 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 Hand will cramp as a result of this. 1st and 3rd digits feel bruised. counterforce brace has helped. Wearing compression glove on hand. Taking mobic but that hasn't seemed to help. No numbness, skin changes. No neck pain.  History reviewed. No pertinent past medical history.  Current Outpatient Medications on File Prior to Visit  Medication Sig Dispense Refill  . fluticasone (FLONASE) 50 MCG/ACT nasal spray Place 1-2 sprays into both nostrils daily as needed for allergies or rhinitis.    . Glucosamine HCl (GLUCOSAMINE PO) Take 1 tablet by mouth daily.    . Multiple Vitamin (MULTIVITAMIN WITH MINERALS) TABS tablet Take 1 tablet by mouth daily. Nutrilite Double X    . OVER THE COUNTER MEDICATION Take 1 tablet by mouth daily. Nutrilite Iron Folic     No current facility-administered medications on file prior to visit.     Past Surgical History:  Procedure Laterality Date  . LAPAROSCOPIC APPENDECTOMY N/A 06/13/2015   Procedure: APPENDECTOMY LAPAROSCOPIC;  Surgeon: Glenna Fellows, MD;  Location: [facility];  Service: General;  Laterality: N/A;    No Known Allergies  Social History   Socioeconomic History  . Marital status: Single    Spouse name: Not on file  . Number of children: Not on file  . Years of education: Not on file  . Highest education level: Not on file  Occupational History  . Not on file  Social Needs  . Financial resource strain: Not on file  . Food insecurity:    Worry: Not on file    Inability: Not on file  . Transportation needs:    Medical: Not on file    Non-medical: Not on file   Tobacco Use  . Smoking status: Never Smoker  . Smokeless tobacco: Never Used  Substance and Sexual Activity  . Alcohol use: No  . Drug use: Not on file  . Sexual activity: Not on file  Lifestyle  . Physical activity:    Days per week: Not on file    Minutes per session: Not on file  . Stress: Not on file  Relationships  . Social connections:    Talks on phone: Not on file    Gets together: Not on file    Attends religious service: Not on file    Active member of club or organization: Not on file    Attends meetings of clubs or organizations: Not on file    Relationship status: Not on file  . Intimate partner violence:    Fear of current or ex partner: Not on file    Emotionally abused: Not on file    Physically abused: Not on file    Forced sexual activity: Not on file  Other Topics Concern  . Not on file  Social History Narrative  . Not on file    History reviewed. No pertinent family history.  BP 137/79   Pulse 68   Ht 5\' 5"  (1.651 m)   Wt 226 lb (102.5 kg)   LMP 05/26/2018   BMI 37.61 kg/m   Review of  Systems: See HPI above.     Objective:  Physical Exam:  Gen: NAD, comfortable in exam room  Neck: No gross deformity, swelling, bruising. No paraspinal TTP .  No midline/bony TTP. FROM without pain. BUE strength 5/5.   Sensation intact to light touch.   2+ equal reflexes in triceps, biceps, brachioradialis tendons. Negative spurlings. NV intact distal BUEs.  Right elbow: No deformity. FROM with 5/5 strength and no pain on resisted wrist extension or 3rd digit extension. No tenderness to palpation. NVI distally. Collateral ligaments intact. Negative tinels cubital tunnel.  Right wrist: No deformity. FROM with 5/5 strength. No tenderness to palpation. NVI distally. Negative tinels carpal tunnel.   Assessment & Plan:  1. Right arm pain - unable to reproduce on exam today.  She describes neuropathic pain based on distribution, severity.  No  exam findings to suggest carpal/cubital tunnel, lateral epicondylitis.  Treat for overuse peripheral neuropathy.  Prednisone dose pack, wrist brace, can continue her counterforce brace and exercises.  F/u in 1 month.

## 2019-02-13 IMAGING — US US EXTREM  UP VENOUS*R*
1 series · 13 of 24 positions shown · non-contrast
Comparison: None.

CLINICAL DATA: Right arm pain for 2 weeks



[Series 1: us extrem up venous*right* · 0.07mm/px · 13 of 32 slices shown]
[im 1/32]
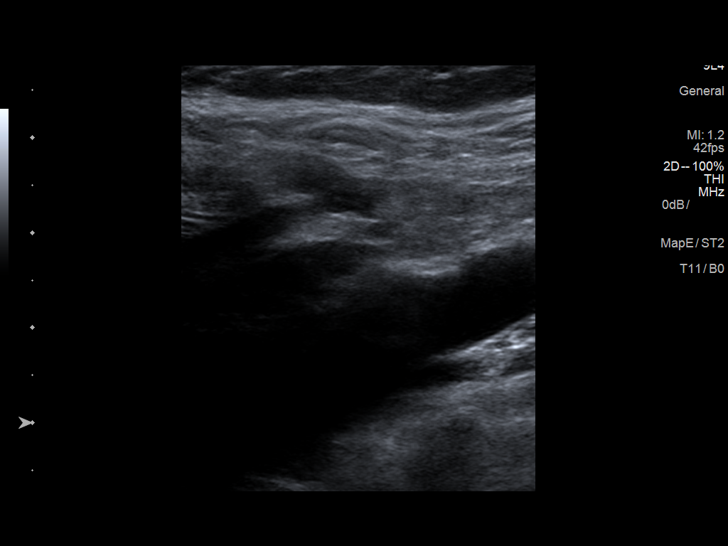
[im 3/32]
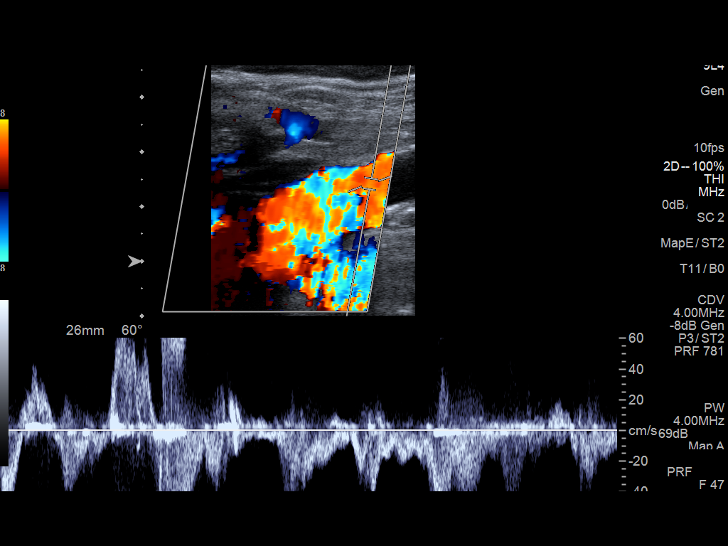
[im 6/32]
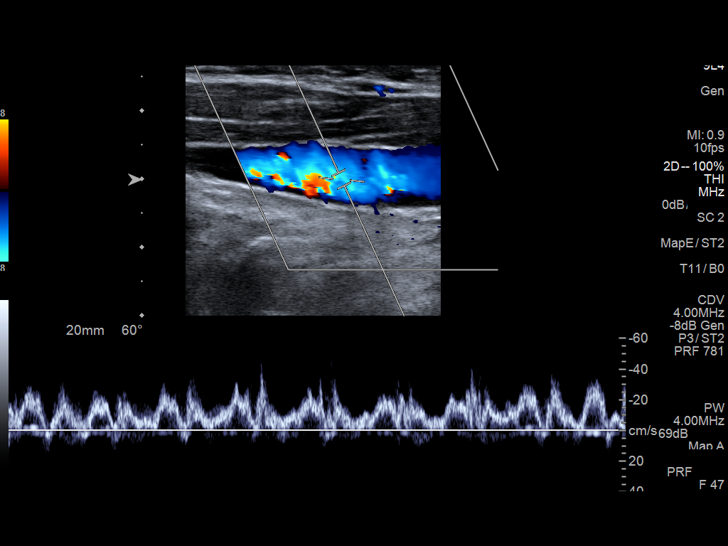
[im 9/32]
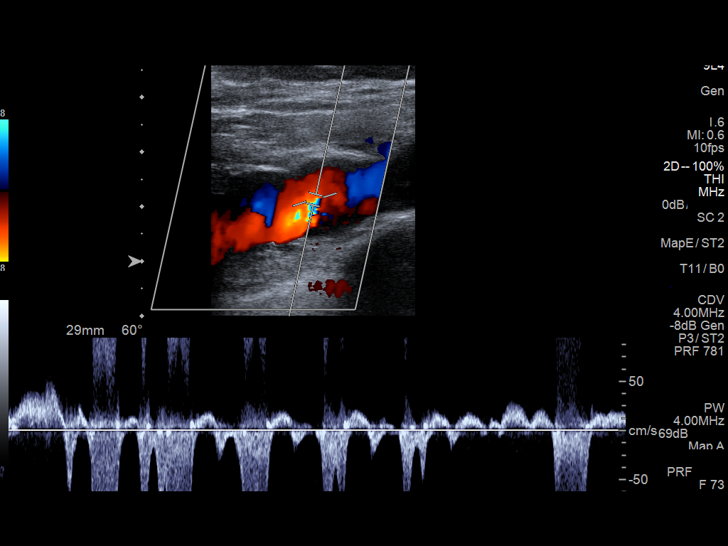
[im 11/32]
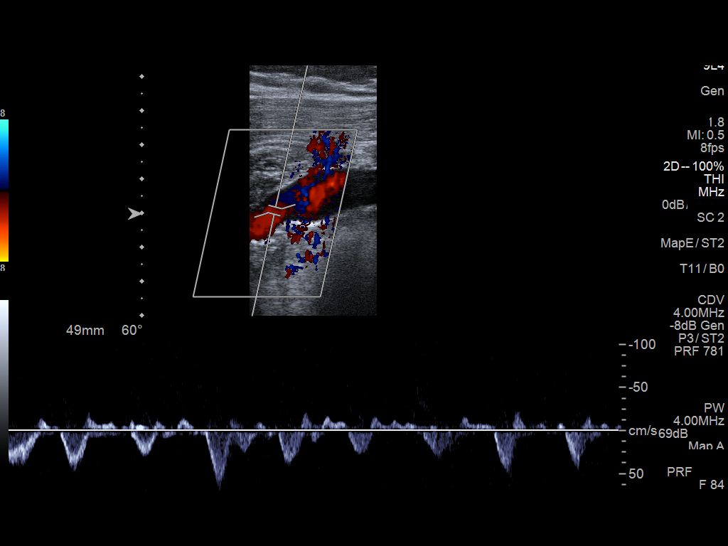
[im 14/32]
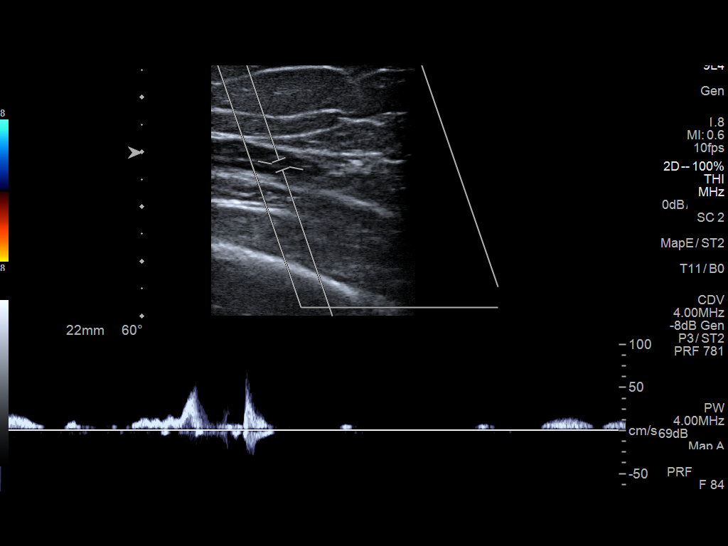
[im 17/32]
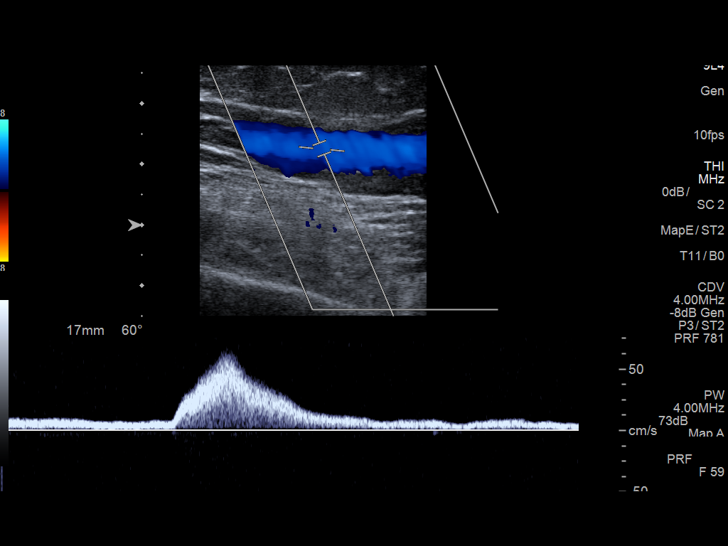
[im 18/32]
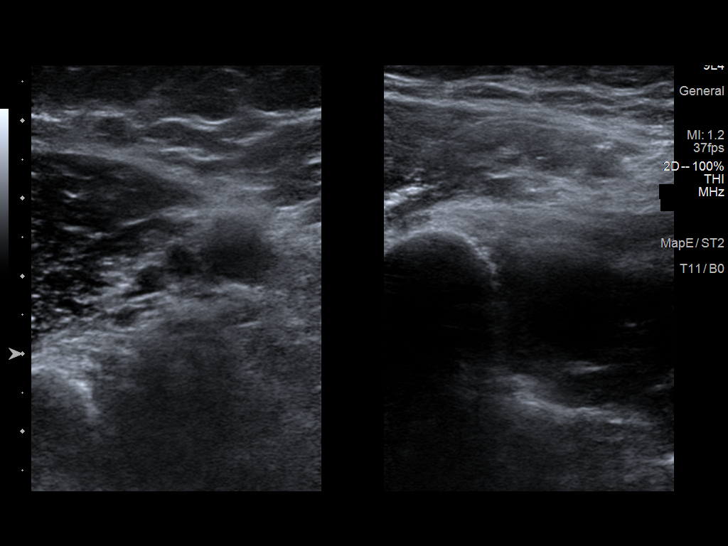
[im 21/32]
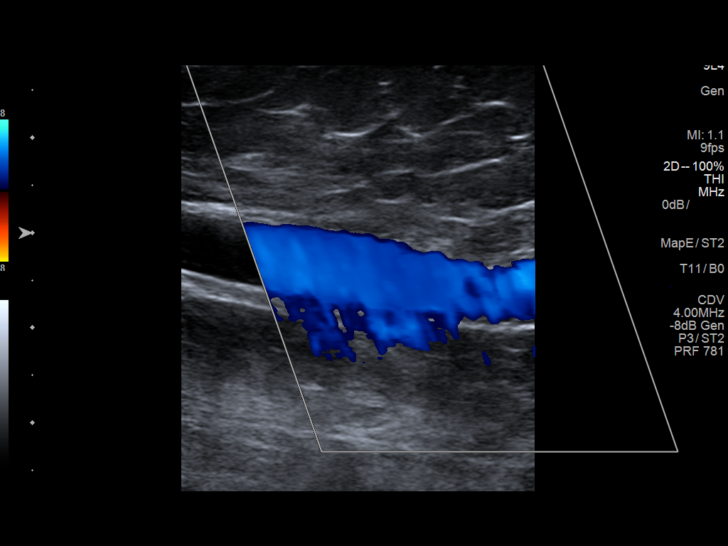
[im 23/32]
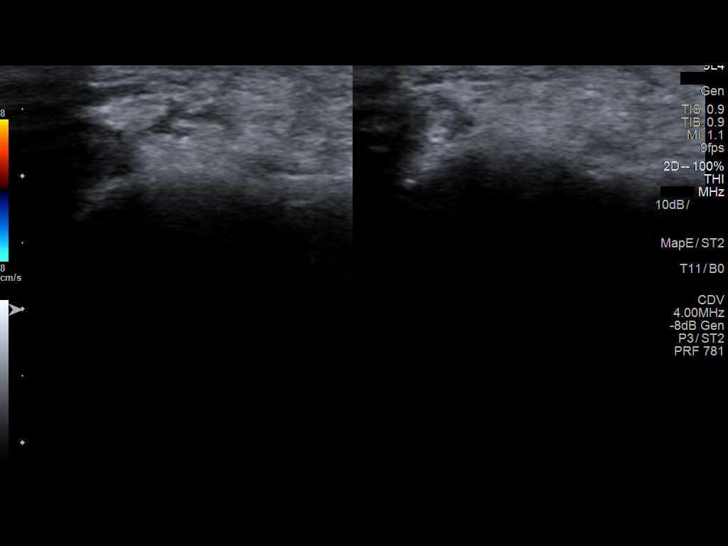
[im 26/32]
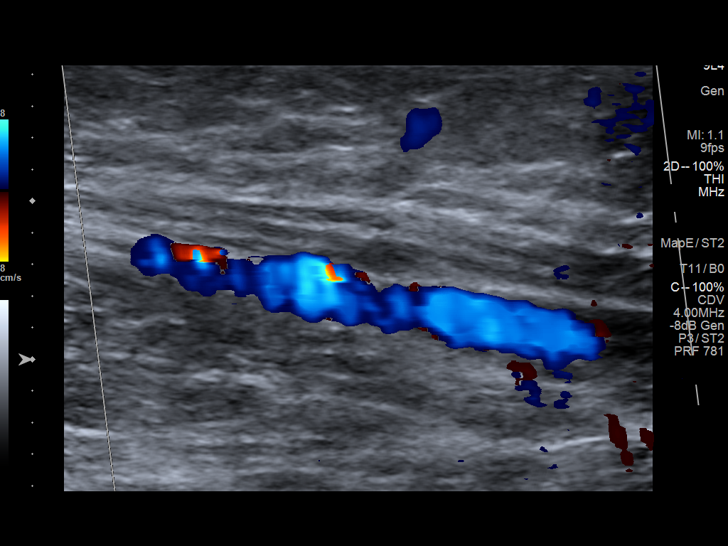
[im 29/32]
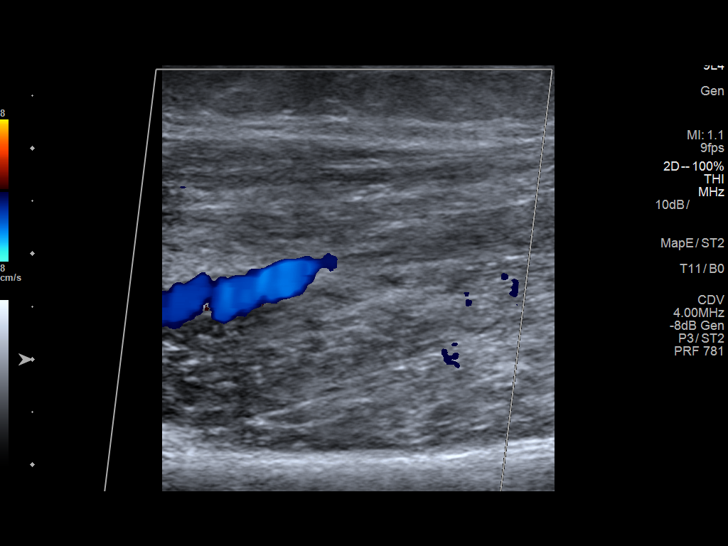
[im 32/32]
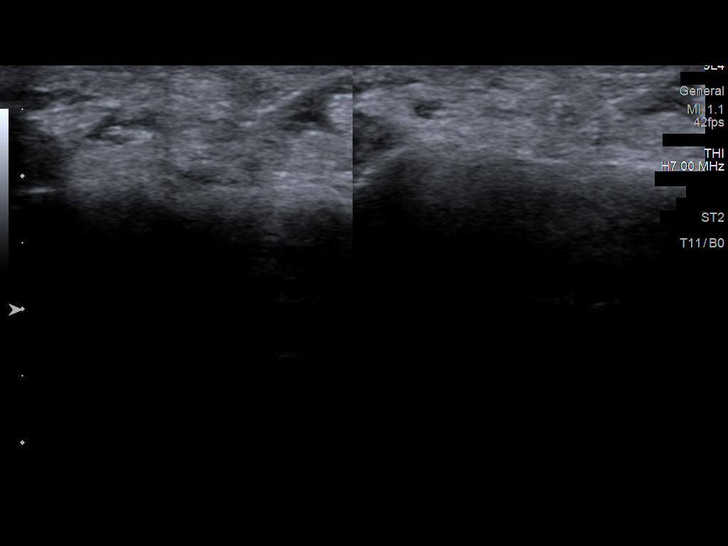

[13 of 24 positions shown; findings below may reference images not displayed]

FINDINGS: Contralateral Subclavian Vein: Respiratory phasicity is normal and
symmetric with the symptomatic side. No evidence of thrombus. Normal
compressibility.

Internal Jugular Vein: No evidence of thrombus. Normal
compressibility, respiratory phasicity and response to augmentation.

Subclavian Vein: No evidence of thrombus. Normal compressibility,
respiratory phasicity and response to augmentation.

Axillary Vein: No evidence of thrombus. Normal compressibility,
respiratory phasicity and response to augmentation.

Cephalic Vein: No evidence of thrombus. Normal compressibility,
respiratory phasicity and response to augmentation.

Basilic Vein: No evidence of thrombus. Normal compressibility,
respiratory phasicity and response to augmentation.

Brachial Veins: No evidence of thrombus. Normal compressibility,
respiratory phasicity and response to augmentation.

Radial Veins: No evidence of thrombus. Normal compressibility,
respiratory phasicity and response to augmentation.

Ulnar Veins: No evidence of thrombus. Normal compressibility,
respiratory phasicity and response to augmentation.

Venous Reflux:  None visualized.

Other Findings:  None visualized.
IMPRESSION: No evidence of DVT within the right upper extremity.
